# Patient Record
Sex: Male | Born: 1960 | Race: White | Hispanic: Yes | Marital: Married | Smoking: Former smoker
Health system: Southern US, Community
[De-identification: ages and names within clinical notes are randomized; demographics above are authoritative.]

## PROBLEM LIST (undated history)

## (undated) DIAGNOSIS — G4733 Obstructive sleep apnea (adult) (pediatric): Secondary | ICD-10-CM

## (undated) DIAGNOSIS — I10 Essential (primary) hypertension: Secondary | ICD-10-CM

## (undated) DIAGNOSIS — M109 Gout, unspecified: Secondary | ICD-10-CM

## (undated) DIAGNOSIS — R0789 Other chest pain: Secondary | ICD-10-CM

## (undated) DIAGNOSIS — E785 Hyperlipidemia, unspecified: Secondary | ICD-10-CM

## (undated) DIAGNOSIS — E7800 Pure hypercholesterolemia, unspecified: Secondary | ICD-10-CM

## (undated) DIAGNOSIS — R739 Hyperglycemia, unspecified: Secondary | ICD-10-CM

## (undated) DIAGNOSIS — E669 Obesity, unspecified: Secondary | ICD-10-CM

## (undated) DIAGNOSIS — R5383 Other fatigue: Secondary | ICD-10-CM

## (undated) DIAGNOSIS — E119 Type 2 diabetes mellitus without complications: Secondary | ICD-10-CM

## (undated) HISTORY — DX: Gout, unspecified: M10.9

## (undated) HISTORY — DX: Pure hypercholesterolemia, unspecified: E78.00

## (undated) HISTORY — PX: ARTHRODESIS: SHX136

## (undated) HISTORY — DX: Essential (primary) hypertension: I10

## (undated) HISTORY — DX: Obstructive sleep apnea (adult) (pediatric): G47.33

## (undated) HISTORY — DX: Obesity, unspecified: E66.9

## (undated) HISTORY — DX: Hyperglycemia, unspecified: R73.9

## (undated) HISTORY — DX: Other fatigue: R53.83

## (undated) HISTORY — DX: Type 2 diabetes mellitus without complications: E11.9

## (undated) HISTORY — DX: Hyperlipidemia, unspecified: E78.5

## (undated) HISTORY — PX: APPENDECTOMY: SHX54

## (undated) HISTORY — DX: Other chest pain: R07.89

---

## 1992-12-22 HISTORY — PX: ARTHROSCOPIC REPAIR ACL: SUR80

## 2015-02-06 ENCOUNTER — Institutional Professional Consult (permissible substitution): Payer: BLUE CROSS/BLUE SHIELD | Admitting: Neurology

## 2015-02-06 ENCOUNTER — Encounter: Payer: Self-pay | Admitting: Neurology

## 2015-02-06 ENCOUNTER — Telehealth: Payer: Self-pay | Admitting: Neurology

## 2015-02-06 NOTE — Telephone Encounter (Signed)
Patient is a no show for today's visit 02/06/15

## 2015-02-14 ENCOUNTER — Encounter: Payer: Self-pay | Admitting: Neurology

## 2015-02-15 ENCOUNTER — Encounter: Payer: Self-pay | Admitting: Neurology

## 2015-02-15 ENCOUNTER — Ambulatory Visit (INDEPENDENT_AMBULATORY_CARE_PROVIDER_SITE_OTHER): Payer: BLUE CROSS/BLUE SHIELD | Admitting: Neurology

## 2015-02-15 VITALS — BP 117/86 | HR 91 | Temp 98.9°F | Resp 20 | Ht 68.5 in | Wt 271.0 lb

## 2015-02-15 DIAGNOSIS — G4733 Obstructive sleep apnea (adult) (pediatric): Secondary | ICD-10-CM

## 2015-02-15 DIAGNOSIS — E669 Obesity, unspecified: Secondary | ICD-10-CM

## 2015-02-15 DIAGNOSIS — G479 Sleep disorder, unspecified: Secondary | ICD-10-CM

## 2015-02-15 DIAGNOSIS — G478 Other sleep disorders: Secondary | ICD-10-CM

## 2015-02-15 DIAGNOSIS — R351 Nocturia: Secondary | ICD-10-CM

## 2015-02-15 NOTE — Progress Notes (Signed)
Subjective:    Patient ID: Tanush Drees is a 54 y.o. male.  HPI     Huston Foley, MD, PhD [facility] [address], [address] [address] [place], [place] [address]  Dear Vonna Kotyk,   I saw your patient, Roye Gustafson, upon your kind request in my neurologic clinic today for initial consultation of his sleep disorder, in particular concern for underlying obstructive sleep apnea. As you know, Mr. Fitzmaurice is a 54 year old right-handed gentleman with an underlying medical history of hypertension, hyperlipidemia, gout, arthritis of both knees, and obesity, who reports snoring and witnessed apneas, restless sleep and nonrestorative sleep. He usually goes to bed late, after 11 PM and he watches TV in bed. In fact, TV may stay on all night. He falls asleep fine but he has difficulty going back to sleep once he wakes up around 2:30 or 3 AM. He goes to the bathroom, and then it may take him up to an hour to go back to sleep. He has been taking an as needed sleep aid called bromazepam which he gets from [place]. He takes 3 mg pills, half a pill as needed typically no more than twice a month. He has restless sleep and endorses some restless leg symptoms but is not sure if he twitches in his sleep. He does not have any nightmares or dream enactments or sleep talking or sleep walking or sleep paralysis or other signs or symptoms of narcolepsy. His Epworth sleepiness score is 3 out of 24. His rise time is 5:30 or 6 AM. He does not feel rested. His mother is suspected to have sleep apnea per his report. 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 He drinks coffee twice a day, not at dinnertime. He does not drink sodas. He drinks alcohol approximately  minimal 4 glasses of wine per week. He quit smoking 20 years ago. He snores, at times loudly. His wife has noted apneic pauses. He has also woken himself up with gasping sensations. Some 3 weeks  ago he had a pulse oximetry test overnight through your office. I will review those test results when available.  His Past Medical History Is Significant For: Past Medical History  Diagnosis Date  . Gout   . Hypertension   . Hypercholesteremia   . Diabetes mellitus without complication   . OSA (obstructive sleep apnea)   . Obesity     moderate  . Atypical chest pain     His Past Surgical History Is Significant For: Past Surgical History  Procedure Laterality Date  . Appendectomy      age 33  . Arthroscopic repair acl Left     1989  . Arthroscopic repair acl Right 1994    His Family History Is Significant For: Family History  Problem Relation Age of Onset  . Heart Problems Father     known hole in heart  . Heart Problems Mother     His Social History Is Significant For: History   Social History  . Marital Status: Married    Spouse Name: Loma Newton  . Number of Children: 1  . Years of Education: Masters   Occupational History  .      [employer]   Social History Main Topics  . Smoking status: Former Games developer  . Smokeless tobacco: Former Neurosurgeon     Comment: quit 20 years ago  . Alcohol Use: 0.0 oz/week    0 Standard drinks or equivalent  per week     Comment: 4-5 drinks weekly  . Drug Use: No  . Sexual Activity: Not on file   Other Topics Concern  . None   Social History Narrative   Patient consumes 1 cup of coffee am and pm    His Allergies Are:  Allergies  Allergen Reactions  . Aspirin Rash  . Sulfa Antibiotics Rash  :   His Current Medications Are:  Outpatient Encounter Prescriptions as of 02/15/2015  Medication Sig  . allopurinol (ZYLOPRIM) 100 MG tablet Take 100 mg by mouth daily.  [name] atorvastatin (LIPITOR) 10 MG tablet   . cefdinir (OMNICEF) 300 MG capsule Take 300 mg by mouth 2 (two) times daily.  . chlorthalidone (HYGROTON) 25 MG tablet Take 25 mg by mouth daily.  [name] eplerenone (INSPRA) 50 MG tablet   . fexofenadine (ALLEGRA) 180 MG tablet Take  180 mg by mouth daily. As needed  . fluticasone (FLOVENT DISKUS) 50 MCG/BLIST diskus inhaler Inhale 1 puff into the lungs daily. One spray each nostril daily as needed  . HYDROcodone-homatropine (HYCODAN) 5-1.5 MG/5ML syrup   . hydrOXYzine (VISTARIL) 25 MG capsule   . meloxicam (MOBIC) 15 MG tablet Take 15 mg by mouth daily.  . metFORMIN (GLUCOPHAGE) 850 MG tablet   . METOPROLOL TARTRATE PO Take 100 mg by mouth daily at 2 PM daily at 2 PM.  . probenecid (BENEMID) 500 MG tablet Take 500 mg by mouth daily.  . valsartan-hydrochlorothiazide (DIOVAN-HCT) 320-25 MG per tablet   . Vitamin D, Ergocalciferol, (DRISDOL) 50000 UNITS CAPS capsule Take 50,000 Units by mouth every 7 (seven) days.  . [DISCONTINUED] colchicine 0.6 MG tablet Take 0.6 mg by mouth daily. As needed  . [DISCONTINUED] LOSARTAN POTASSIUM PO Take 100 mg by mouth daily at 2 PM daily at 2 PM.  . [DISCONTINUED] lovastatin (MEVACOR) 40 MG tablet Take 40 mg by mouth at bedtime.  :  Review of Systems:  Out of a complete 14 point review of systems, all are reviewed and negative with the exception of these symptoms as listed below:   Review of Systems  Constitutional:       Weight gain  HENT:       Ringing in ears  Neurological:       Snoring  Psychiatric/Behavioral:       Not enough sleep    Objective:  Neurologic Exam  Physical Exam Physical Examination:   Filed Vitals:   02/15/15 0853  BP: 117/86  Pulse: 91  Temp: 98.9 F (37.2 C)  Resp: 20    General Examination: The patient is a very pleasant 54 y.o. male in no acute distress. He appears well-developed and well-nourished and very well groomed. He is obese.   HEENT: Normocephalic, atraumatic, pupils are equal, round and reactive to light and accommodation. Funduscopic exam is normal with sharp disc margins noted. Extraocular tracking is good without limitation to gaze excursion or nystagmus noted. Normal smooth pursuit is noted. Hearing is grossly intact. Tympanic  membranes are clear bilaterally. Face is symmetric with normal facial animation and normal facial sensation. Speech is clear with no dysarthria noted. There is no hypophonia. There is no lip, neck/head, jaw or voice tremor. Neck is supple with full range of passive and active motion. There are no carotid bruits on auscultation. Oropharynx exam reveals: mild mouth dryness, adequate dental hygiene and moderate airway crowding, due to narrow airway entry, tonsils are 1+ bilaterally, and large tongue Mallampati is class II. Tongue protrudes centrally and  palate elevates symmetrically. Tonsils are 1+. Neck size is 21.5 inches. He has a Mild overbite. Nasal inspection reveals no significant nasal mucosal bogginess or redness and no septal deviation.   Chest: Clear to auscultation without wheezing, rhonchi or crackles noted.  Heart: S1+S2+0, regular and normal without murmurs, rubs or gallops noted.   Abdomen: Soft, non-tender and non-distended with normal bowel sounds appreciated on auscultation.  Extremities: There is trace pitting edema in the distal lower extremities bilaterally. Pedal pulses are intact.  Skin: Warm and dry without trophic changes noted. There are no varicose veins.  Musculoskeletal: exam reveals no obvious joint deformities, tenderness or joint swelling or erythema.   Neurologically:  Mental status: The patient is awake, alert and oriented in all 4 spheres. His immediate and remote memory, attention, language skills and fund of knowledge are appropriate. There is no evidence of aphasia, agnosia, apraxia or anomia. Speech is clear with normal prosody and enunciation. Thought process is linear. Mood is normal and affect is normal.  Cranial nerves II - XII are as described above under HEENT exam. In addition: shoulder shrug is normal with equal shoulder height noted. Motor exam: Normal bulk, strength and tone is noted. There is no drift, tremor or rebound. Romberg is negative. Reflexes  are 2+ throughout. Babinski: Toes are flexor bilaterally. Fine motor skills and coordination: intact with normal finger taps, normal hand movements, normal rapid alternating patting, normal foot taps and normal foot agility.  Cerebellar testing: No dysmetria or intention tremor on finger to nose testing. Heel to shin is unremarkable bilaterally. There is no truncal or gait ataxia.  Sensory exam: intact to light touch, pinprick, vibration, temperature sense in the upper and lower extremities.  Gait, station and balance: He stands easily. No veering to one side is noted. No leaning to one side is noted. Posture is age-appropriate and stance is narrow based. Gait shows normal stride length and normal pace. No problems turning are noted. He turns en bloc. Tandem walk is unremarkable.  Assessment and Plan:   In summary, Cline CoolsMarvin Southgate is a very pleasant 54 y.o.-year old male with an underlying medical history of hypertension, hyperlipidemia, gout, arthritis of both knees, and obesity, who has a telltale history for obstructive sleep apnea. His physical exam is concerning for underlying OSA. Otherwise his exam is nonfocal and I reassured them in that regard. I had a long chat with the patient about my findings and the diagnosis of OSA, its prognosis and treatment options. We talked about medical treatments, surgical interventions and non-pharmacological approaches. I explained in particular the risks and ramifications of untreated moderate to severe OSA, especially with respect to developing cardiovascular disease down the Road, including congestive heart failure, difficult to treat hypertension, cardiac arrhythmias, or stroke. Even type 2 diabetes has, in part, been linked to untreated OSA. Symptoms of untreated OSA include daytime sleepiness, memory problems, mood irritability and mood disorder such as depression and anxiety, lack of energy, as well as recurrent headaches, especially morning headaches. We talked  about trying to maintain a healthy lifestyle in general, as well as the importance of weight control. I encouraged the patient to eat healthy, exercise daily and keep well hydrated, to keep a scheduled bedtime and wake time routine, to not skip any meals and eat healthy snacks in between meals. I advised the patient not to drive when feeling sleepy. I recommended the following at this time: sleep study with potential positive airway pressure titration. (We will score hypopneas at 3%  and split the sleep study into diagnostic and treatment portion, if the estimated. 2 hour AHI is >15/h).   I explained the sleep test procedure to the patient and also outlined possible surgical and non-surgical treatment options of OSA, including the use of a custom-made dental device (which would require a referral to a specialist dentist or oral surgeon), upper airway surgical options, such as pillar implants, radiofrequency surgery, tongue base surgery, and UPPP (which would involve a referral to an ENT surgeon). Rarely, jaw surgery such as mandibular advancement may be considered.  I also explained the CPAP treatment option to the patient, who indicated that he would be willing to try CPAP if the need arises. I explained the importance of being compliant with PAP treatment, not only for insurance purposes but primarily to improve His symptoms, and for the patient's long term health benefit, including to reduce His cardiovascular risks. I answered all his questions today and the patient was in agreement. I would like to see him back after the sleep study is completed and encouraged him to call with any interim questions, concerns, problems or updates.   Thank you very much for allowing me to participate in the care of this nice patient. If I can be of any further assistance to you please do not hesitate to call me at [phone].  Sincerely,   Huston Foley, MD, PhD

## 2015-02-15 NOTE — Patient Instructions (Signed)

## 2015-04-21 ENCOUNTER — Telehealth: Payer: Self-pay | Admitting: *Deleted

## 2019-02-07 ENCOUNTER — Encounter: Payer: Self-pay | Admitting: Cardiology

## 2019-02-07 DIAGNOSIS — R739 Hyperglycemia, unspecified: Secondary | ICD-10-CM

## 2019-02-07 DIAGNOSIS — E7800 Pure hypercholesterolemia, unspecified: Secondary | ICD-10-CM | POA: Insufficient documentation

## 2019-02-07 DIAGNOSIS — I10 Essential (primary) hypertension: Secondary | ICD-10-CM

## 2019-02-07 DIAGNOSIS — E785 Hyperlipidemia, unspecified: Secondary | ICD-10-CM

## 2019-02-07 DIAGNOSIS — Z0189 Encounter for other specified special examinations: Secondary | ICD-10-CM | POA: Insufficient documentation

## 2019-02-07 HISTORY — DX: Essential (primary) hypertension: I10

## 2019-02-07 HISTORY — DX: Hyperglycemia, unspecified: R73.9

## 2019-02-07 HISTORY — DX: Hyperlipidemia, unspecified: E78.5

## 2019-02-08 DIAGNOSIS — R7303 Prediabetes: Secondary | ICD-10-CM | POA: Insufficient documentation

## 2019-02-08 DIAGNOSIS — G4733 Obstructive sleep apnea (adult) (pediatric): Secondary | ICD-10-CM | POA: Insufficient documentation

## 2019-02-08 DIAGNOSIS — M109 Gout, unspecified: Secondary | ICD-10-CM | POA: Insufficient documentation

## 2019-02-08 DIAGNOSIS — E669 Obesity, unspecified: Secondary | ICD-10-CM | POA: Insufficient documentation

## 2019-02-08 DIAGNOSIS — G4700 Insomnia, unspecified: Secondary | ICD-10-CM | POA: Insufficient documentation

## 2019-02-08 DIAGNOSIS — R5383 Other fatigue: Secondary | ICD-10-CM

## 2019-02-08 DIAGNOSIS — I2781 Cor pulmonale (chronic): Secondary | ICD-10-CM | POA: Insufficient documentation

## 2019-02-08 DIAGNOSIS — R0789 Other chest pain: Secondary | ICD-10-CM | POA: Insufficient documentation

## 2019-02-08 HISTORY — DX: Obstructive sleep apnea (adult) (pediatric): G47.33

## 2019-02-08 HISTORY — DX: Other fatigue: R53.83

## 2019-02-08 NOTE — Progress Notes (Signed)
Subjective:  Primary Physician:  Jeff Heman, NP  Patient ID: Jeff Brewer, male    DOB: [birth year], 58 y.o.   MRN: [id number]  Chief Complaint  Patient presents with  . Hypertension    6 month f/u    HPI: Jeff Brewer  is a 58 y.o. male  with  hypertension, hyperlipidemia, hyperglycemia, prediabetes, mild obesity, family history of premature is CAD, gout, chronic corpulmonale and OSA on CPAP, Atypical chest pain with positive nuclear stress test in February 2016 revealing abnormal EKG response to treadmill stress test along with inferior apical ischemia, was started on aggressive medical therapy including weight loss.    He had complete resolution of symptoms and with weight loss his labs improved.  He initially weighed greater than 275 pounds with a BMI of close to morbid obesity of 39.  He now presents for 6 month follow-up visit.  Echocardiogram in 2016had revealed mild RV dilatation.  States that he is feeling the best he has but weight loss has plateaued.he has no chest pain or dyspnea.  Due to withdrawal of Belviq which she has been on for about a year to year and a half,he is concerned about weight gain.  Prior to this he was on Contrave for a year and a half.  Past Medical History:  Diagnosis Date  . Atypical chest pain   . Diabetes mellitus without complication ([place])   . Essential hypertension 02/07/2019  . Fatigue 02/08/2019  . Gout   . Hypercholesteremia   . Hyperglycemia 02/07/2019  . Hyperlipidemia 02/07/2019  . Hypertension   . Obesity    moderate  . Obstructive sleep apnea 02/08/2019   Resolved with weight loss  . OSA (obstructive sleep apnea)     Past Surgical History:  Procedure Laterality Date  . APPENDECTOMY     age 62  . ARTHROSCOPIC REPAIR ACL Left    1989  . ARTHROSCOPIC REPAIR ACL Right 1994    Social History   Socioeconomic History  . Marital status: Married    Spouse name: Jeff Brewer  . Number of children: 1  . Years of education:  Masters  . Highest education level: Not on file  Occupational History    Comment: [employer]  Social Needs  . Financial resource strain: Not on file  . Food insecurity:    Worry: Not on file    Inability: Not on file  . Transportation needs:    Medical: Not on file    Non-medical: Not on file  Tobacco Use  . Smoking status: Former Research scientist (life sciences)  . Smokeless tobacco: Former Systems developer  . Tobacco comment: quit 20 years ago  Substance and Sexual Activity  . Alcohol use: Yes    Alcohol/week: 0.0 standard drinks    Comment: 4-5 drinks weekly  . Drug use: No  . Sexual activity: Not on file  Lifestyle  . Physical activity:    Days per week: Not on file    Minutes per session: Not on file  . Stress: Not on file  Relationships  . Social connections:    Talks on phone: Not on file    Gets together: Not on file    Attends religious service: Not on file    Active member of club or organization: Not on file    Attends meetings of clubs or organizations: Not on file    Relationship status: Not on file  . Intimate partner violence:    Fear of current or ex partner: Not on  file    Emotionally abused: Not on file    Physically abused: Not on file    Forced sexual activity: Not on file  Other Topics Concern  . Not on file  Social History Narrative   Patient consumes 1 cup of coffee am and pm    Current Outpatient Medications on File Prior to Visit  Medication Sig Dispense Refill  . allopurinol (ZYLOPRIM) 100 MG tablet Take 100 mg by mouth daily.    [name] ALPRAZolam (XANAX XR) 0.5 MG 24 hr tablet Take 0.5 mg by mouth daily.    [name] aspirin EC 81 MG tablet Take 81 mg by mouth daily.    . colchicine 0.6 MG tablet Take 0.6 mg by mouth daily.    . fexofenadine (ALLEGRA) 180 MG tablet Take 180 mg by mouth daily. As needed    . fluticasone (FLOVENT DISKUS) 50 MCG/BLIST diskus inhaler Inhale 1 puff into the lungs daily. One spray each nostril daily as needed    . hydrALAZINE (APRESOLINE) 25 MG tablet Take 25  mg by mouth 3 (three) times daily.    . Lorcaserin HCl ER (BELVIQ XR) 20 MG TB24 Take 20 mg by mouth daily.    . meloxicam (MOBIC) 15 MG tablet Take 15 mg by mouth daily.    . Naltrexone-buPROPion HCl ER (CONTRAVE) 8-90 MG TB12 Take 1 tablet by mouth 2 (two) times a week.    . NON FORMULARY Honey ginger with lime    . rosuvastatin (CRESTOR) 20 MG tablet Take 20 mg by mouth daily.    . sildenafil (VIAGRA) 100 MG tablet Take 100 mg by mouth daily as needed for erectile dysfunction.    . valsartan-hydrochlorothiazide (DIOVAN-HCT) 320-25 MG per tablet   0  . verapamil (VERELAN PM) 180 MG 24 hr capsule Take 180 mg by mouth at bedtime.    . Vitamin D, Ergocalciferol, (DRISDOL) 50000 UNITS CAPS capsule Take 50,000 Units by mouth every 7 (seven) days.    [name] eplerenone (INSPRA) 50 MG tablet   0  . METOPROLOL TARTRATE PO Take 100 mg by mouth daily at 2 PM daily at 2 PM.    . probenecid (BENEMID) 500 MG tablet Take 500 mg by mouth daily.     No current facility-administered medications on file prior to visit.      Review of Systems  Constitutional: Negative for malaise/fatigue and weight loss.  Respiratory: Negative for cough, hemoptysis and shortness of breath.   Cardiovascular: Negative for chest pain, palpitations, claudication and leg swelling.  Gastrointestinal: Negative for abdominal pain, blood in stool, constipation, heartburn and vomiting.  Genitourinary: Negative for dysuria.  Musculoskeletal: Negative for joint pain and myalgias.  Neurological: Negative for dizziness, focal weakness and headaches.  Endo/Heme/Allergies: Does not bruise/bleed easily.  Psychiatric/Behavioral: Negative for depression. The patient is not nervous/anxious.   All other systems reviewed and are negative.      Objective:  Blood pressure (!) 136/57, pulse 76, height '5\' 10"'$  (1.778 m), weight 236 lb (107 kg), SpO2 97 %. Body mass index is 33.86 kg/m.   Physical Exam  Constitutional: He appears well-developed.  No distress.  Mildly obese  HENT:  Head: Atraumatic.  Eyes: Conjunctivae are normal.  Neck: Neck supple. No JVD present. No thyromegaly present.  Cardiovascular: Normal rate, regular rhythm, normal heart sounds and intact distal pulses. Exam reveals no gallop.  No murmur heard. Pulmonary/Chest: Effort normal and breath sounds normal.  Abdominal: Soft. Bowel sounds are normal.  Musculoskeletal: Normal range of  motion.        General: No edema.  Neurological: He is alert.  Skin: Skin is warm and dry.  Psychiatric: He has a normal mood and affect.   CARDIAC STUDIES:  Exercise sestamibi stress test 01/26/2015: 1. The resting electrocardiogram demonstrated normal sinus rhythm, normal resting conduction, no resting arrhythmias and normal rest repolarization. Low voltage.   The stress electrocardiogram revealed 1 mm of upsloping ST depression in lead (s):II, III, aVF, V5, V6 which was back to base at < 2 minutes into recovery. The patient performed treadmill exercise using a Bruce protocol, completing 7 minutes, an estimated workload of 7.3 METS. The heart rate was 90% MPHR. The stress test was terminated because of fatigue and dyspnea. 2. There is suggestion of very mild mid inferior to inferoapical ischemia. Gated SPECT images reveal normal myocardial thickening and wall motion.  The left ventricular ejection fraction was calculated or visually estimated to be 57%, normal.  Overall this represents a low risk study, clinical correlation recommended the patient weighs 270 pounds.  Echo- 01/30/15 1. Left ventricle cavity is normal in size. Mild to moderate concentric hypertrophy of the left ventricle. Normal global wall motion. Calculated EF 55%. 2. Right atrial cavity is slightly dilated. 3. Right ventricle cavity is slightly dilated. Normal right ventricular function. 4. Trace mitral regurgitation. 5. Trace tricuspid regurgitation. No evidence of pulmonary hypertension.  Assessment &  Recommendations:   1. Chronic cor pulmonale (HCC) Mild RV dilatation was noted in 2016, I suspect is probably complete resolution of the same, he remains completely asymptomatic, BMI has reduced from close to being 40 to the present 33.  2. Essential hypertension EKG 02/09/2019: Normal sinus rhythm at rate of 7 1 bpm, normal axis.  No evidence of ischemia, normal EKG.  Baseline artifact.  3. Pure hypercholesterolemia Needs lipid profile testing. 4. Obesity, unspecified classification, unspecified obesity type, unspecified whether serious comorbidity present  5. Hyperglycemia Significant improvement in glycemic control since weight loss.  He has maintained weight loss over the past 3 years.  6. Laboratory examination Labs 01/11/2019: Serum glucose 87 mg, BUN 16, creatinine 1.33, eGFR 68 mL.  08/06/2018: TSH 2.79.  07/29/2018: Hemoglobin A1c 5.7%.  Uric acid 6.9.  03/31/2018: Creatinine 1.27, EGFR 62/72, potassium 4.3, CMP normal.  Cholesterol 136, triglycerides 161, HDL 51, LDL 53.  Hemoglobin A1c 6.4%.  06/23/2017: Cholesterol 126, triglycerides 146, HDL 40, LDL 57.  Creatinine 1.2, potassium 4.3, CMP normal.  Uric acid normal at 7.5.  7. Obstructive sleep apnea Has resolved.  Recommendation: He has now established with Ms. Tora Perches, NP, .I have discussed with him regarding withdrawal of Belviq from the market and the risk of colorectal cancer, lung cancer.  I have reassured him that as he has not been smoking actively, and he has had routine colonoscopy screening, overall risk is very low. Blood pressure is well controlled, needs lipid profile testing, I do not think he needs a repeat echocardiogram to confirm resolution of RV dilatation.  There is no clinical evidence of congestive heart failure.  The benefits of weight loss, overall health, in a patient with previous RV dilatation and chronic cor pulmonale an abnormal nuclear stress test out weight the minimal risk associated with the  therapy but would recommend no to continue any further weight loss medications and permanent life style changes discussed.  I will see him back in one year.  Adrian Prows, MD, [facility] 02/10/2019, 6:22 AM [facility] [place] Pager: [phone] Office: [phone] If no answer  Cell [phone]   .

## 2019-02-09 ENCOUNTER — Ambulatory Visit (INDEPENDENT_AMBULATORY_CARE_PROVIDER_SITE_OTHER): Payer: BC Managed Care – PPO | Admitting: Cardiology

## 2019-02-09 ENCOUNTER — Encounter: Payer: Self-pay | Admitting: Cardiology

## 2019-02-09 ENCOUNTER — Other Ambulatory Visit: Payer: Self-pay

## 2019-02-09 VITALS — BP 136/57 | HR 76 | Ht 70.0 in | Wt 236.0 lb

## 2019-02-09 DIAGNOSIS — E66811 Obesity, class 1: Secondary | ICD-10-CM

## 2019-02-09 DIAGNOSIS — Z0189 Encounter for other specified special examinations: Secondary | ICD-10-CM

## 2019-02-09 DIAGNOSIS — E7800 Pure hypercholesterolemia, unspecified: Secondary | ICD-10-CM

## 2019-02-09 DIAGNOSIS — R739 Hyperglycemia, unspecified: Secondary | ICD-10-CM

## 2019-02-09 DIAGNOSIS — I10 Essential (primary) hypertension: Secondary | ICD-10-CM

## 2019-02-09 DIAGNOSIS — Z6833 Body mass index (BMI) 33.0-33.9, adult: Secondary | ICD-10-CM

## 2019-02-09 DIAGNOSIS — I2781 Cor pulmonale (chronic): Secondary | ICD-10-CM

## 2019-02-09 DIAGNOSIS — Z1211 Encounter for screening for malignant neoplasm of colon: Secondary | ICD-10-CM

## 2019-02-09 DIAGNOSIS — E6609 Other obesity due to excess calories: Secondary | ICD-10-CM

## 2019-02-09 DIAGNOSIS — Z125 Encounter for screening for malignant neoplasm of prostate: Secondary | ICD-10-CM

## 2019-02-10 ENCOUNTER — Telehealth: Payer: Self-pay

## 2019-02-10 NOTE — Telephone Encounter (Signed)
Jeff Brewer from [facility] called stated that patients current medication Belviq is being taken off the market due to increase cancer risk. Is there an alternative medication patient can take?

## 2019-02-11 NOTE — Telephone Encounter (Signed)
I am going to monitor his weight and the medication effects are already discussed with the patient

## 2019-05-13 ENCOUNTER — Other Ambulatory Visit: Payer: Self-pay | Admitting: Cardiology

## 2019-05-23 ENCOUNTER — Telehealth: Payer: Self-pay

## 2019-05-23 NOTE — Telephone Encounter (Signed)
Pt called wants to let you know that he has gained 7 lb and you told him if this happens its some sort of shot he can get ? Please advise

## 2019-05-23 NOTE — Telephone Encounter (Signed)
Saxenda, SQ, I can send  in the Rx

## 2019-05-24 ENCOUNTER — Ambulatory Visit (INDEPENDENT_AMBULATORY_CARE_PROVIDER_SITE_OTHER): Payer: BC Managed Care – PPO | Admitting: Cardiology

## 2019-05-24 DIAGNOSIS — R739 Hyperglycemia, unspecified: Secondary | ICD-10-CM

## 2019-05-24 DIAGNOSIS — E669 Obesity, unspecified: Secondary | ICD-10-CM | POA: Diagnosis not present

## 2019-05-24 DIAGNOSIS — I10 Essential (primary) hypertension: Secondary | ICD-10-CM | POA: Diagnosis not present

## 2019-05-24 NOTE — Telephone Encounter (Signed)
Pt came into office and was educated on Saxenda and administered injection.//ah

## 2019-05-25 ENCOUNTER — Other Ambulatory Visit: Payer: Self-pay

## 2019-05-26 ENCOUNTER — Other Ambulatory Visit: Payer: Self-pay | Admitting: Cardiology

## 2019-05-26 MED ORDER — LIRAGLUTIDE -WEIGHT MANAGEMENT 18 MG/3ML ~~LOC~~ SOPN: 0.6000 mL | PEN_INJECTOR | Freq: Every day | SUBCUTANEOUS | 0 refills | Status: DC

## 2019-05-28 MED ORDER — LIRAGLUTIDE -WEIGHT MANAGEMENT 18 MG/3ML ~~LOC~~ SOPN: 3.0000 mg | PEN_INJECTOR | Freq: Every day | SUBCUTANEOUS | 3 refills | Status: DC

## 2019-05-28 MED ORDER — "NEEDLE (DISP) 23G X 1"" MISC": 1.0000 [IU] | Freq: Every day | 4 refills | Status: DC

## 2019-05-28 NOTE — Progress Notes (Signed)
Jeff Brewer  is a 58 y.o. male  with  hypertension, hyperlipidemia, hyperglycemia, prediabetes, mild obesity, family history of premature is CAD, gout, chronic corpulmonale and OSA on CPAP, Atypical chest pain with positive nuclear stress test in February 2016 revealing abnormal EKG response to treadmill stress test along with inferior apical ischemia, was started on aggressive medical therapy including weight loss.    He had complete resolution of symptoms and with weight loss his labs improved.  He initially weighed greater than 275 pounds with a BMI of close to morbid obesity of 39.  He now presents for 6 month follow-up visit.  Echocardiogram in 2016 had revealed mild RV dilatation.   He has started to gain weight, wanted to try Sexenda and here to get started.  He is aware of the risks associated with the medication including but not limited to severe nausea, pancreatitis, insomnia.  Patient very reliable and is willing to monitor.  Mild obesity - Plan: Liraglutide -Weight Management (SAXENDA) 18 MG/3ML SOPN  Essential hypertension  Hyperglycemia - Plan: Liraglutide -Weight Management (SAXENDA) 18 MG/3ML SOPN, NEEDLE, DISP, 23 G (BD DISP NEEDLE) 23G X 1" MISC, CMP14+EGFR

## 2019-05-28 NOTE — Addendum Note (Signed)
Addended by: Kela Millin on: 05/28/2019 08:44 AM   Modules accepted: Orders

## 2019-06-10 MED ORDER — LIRAGLUTIDE -WEIGHT MANAGEMENT 18 MG/3ML ~~LOC~~ SOPN
18.0000 mL | PEN_INJECTOR | Freq: Once | SUBCUTANEOUS | Status: AC
  Administered 2019-06-10: 18 mL via SUBCUTANEOUS

## 2019-06-10 NOTE — Addendum Note (Signed)
Addended by: Georgeanna Harrison on: 06/10/2019 04:20 PM   Modules accepted: Orders

## 2019-06-27 ENCOUNTER — Encounter: Payer: Self-pay | Admitting: Cardiology

## 2019-06-27 ENCOUNTER — Ambulatory Visit: Payer: BC Managed Care – PPO | Admitting: Cardiology

## 2019-06-27 ENCOUNTER — Other Ambulatory Visit: Payer: Self-pay

## 2019-06-27 VITALS — BP 120/70 | HR 82 | Ht 70.0 in | Wt 244.0 lb

## 2019-06-27 DIAGNOSIS — Z6835 Body mass index (BMI) 35.0-35.9, adult: Secondary | ICD-10-CM | POA: Diagnosis not present

## 2019-06-27 DIAGNOSIS — I10 Essential (primary) hypertension: Secondary | ICD-10-CM

## 2019-06-27 MED ORDER — PHENTERMINE HCL 15 MG PO CAPS: 15.0000 mg | ORAL_CAPSULE | Freq: Two times a day (BID) | ORAL | 2 refills | Status: DC

## 2019-06-27 NOTE — Progress Notes (Signed)
Virtual Visit via Telephone Note: Patient unable to use video assisted device.  This visit type was conducted due to national recommendations for restrictions regarding the COVID-19 Pandemic (e.g. social distancing).  This format is felt to be most appropriate for this patient at this time.  All issues noted in this document were discussed and addressed.  No physical exam was performed.  The patient has consented to conduct a Telehealth visit and understands insurance will be billed.   I connected with@, on 06/27/19 at  by TELEPHONE and verified that I am speaking with the correct person using two identifiers.   I discussed the limitations of evaluation and management by telemedicine and the availability of in person appointments. The patient expressed understanding and agreed to proceed.   I have discussed with patient regarding the safety during COVID Pandemic and steps and precautions to be taken including social distancing, frequent hand wash and use of detergent soap, gels with the patient. I asked the patient to avoid touching mouth, nose, eyes, ears with the hands. I encouraged regular walking around the neighborhood and exercise and regular diet, as long as social distancing can be maintained.  Primary Physician/Referring:  Belia Heman, NP  Patient ID: Jeff Brewer, male    DOB: Nov 03, 1961, 58 y.o.   MRN: [id number]  Chief Complaint  Patient presents with  . weight medication    pt gained weight    HPI: Jeff Brewer  is a 58 y.o. male  with  hypertension, hyperlipidemia, hyperglycemia, prediabetes, mild obesity, family history of premature is CAD, gout, chronic corpulmonale and OSA on CPAP, Atypical chest pain with positive nuclear stress test in February 2016 revealing abnormal EKG response to treadmill stress test along with inferior apical ischemia, was started on aggressive medical therapy including weight loss.    He had complete resolution of symptoms and with weight  loss his labs improved.  He initially weighed greater than 275 pounds with a BMI of close to morbid obesity of 39. He tried [place] for weight loss, but finds it too expensive and has again gained weight and he is worried that he will be back to his original weight. No chest pain or dyspnea.   Past Medical History:  Diagnosis Date  . Atypical chest pain   . Diabetes mellitus without complication ([place])   . Essential hypertension 02/07/2019  . Fatigue 02/08/2019  . Gout   . Hypercholesteremia   . Hyperglycemia 02/07/2019  . Hyperlipidemia 02/07/2019  . Hypertension   . Obesity    moderate  . Obstructive sleep apnea 02/08/2019   Resolved with weight loss  . OSA (obstructive sleep apnea)     Past Surgical History:  Procedure Laterality Date  . APPENDECTOMY     age 58  . ARTHROSCOPIC REPAIR ACL Left    1989  . ARTHROSCOPIC REPAIR ACL Right 1994    Social History   Socioeconomic History  . Marital status: Married    Spouse name: Ardelle Park  . Number of children: 1  . Years of education: Masters  . Highest education level: Not on file  Occupational History    Comment: [employer]  Social Needs  . Financial resource strain: Not on file  . Food insecurity    Worry: Not on file    Inability: Not on file  . Transportation needs    Medical: Not on file    Non-medical: Not on file  Tobacco Use  . Smoking status: Former Research scientist (life sciences)  . Smokeless tobacco: Former  User  . Tobacco comment: quit 20 years ago  Substance and Sexual Activity  . Alcohol use: Yes    Alcohol/week: 0.0 standard drinks    Comment: 4-5 drinks weekly  . Drug use: No  . Sexual activity: Not on file  Lifestyle  . Physical activity    Days per week: Not on file    Minutes per session: Not on file  . Stress: Not on file  Relationships  . Social Herbalist on phone: Not on file    Gets together: Not on file    Attends religious service: Not on file    Active member of club or organization: Not on file     Attends meetings of clubs or organizations: Not on file    Relationship status: Not on file  . Intimate partner violence    Fear of current or ex partner: Not on file    Emotionally abused: Not on file    Physically abused: Not on file    Forced sexual activity: Not on file  Other Topics Concern  . Not on file  Social History Narrative   Patient consumes 1 cup of coffee am and pm    Review of Systems  Constitution: Positive for weight gain. Negative for chills, decreased appetite and malaise/fatigue.  Cardiovascular: Negative for dyspnea on exertion, leg swelling and syncope.  Endocrine: Negative for cold intolerance.  Hematologic/Lymphatic: Does not bruise/bleed easily.  Musculoskeletal: Positive for arthritis (bilateral knee). Negative for joint swelling.  Gastrointestinal: Negative for abdominal pain, anorexia, change in bowel habit, hematochezia and melena.  Neurological: Negative for headaches and light-headedness.  Psychiatric/Behavioral: Negative for depression and substance abuse.  All other systems reviewed and are negative.   Objective  Blood pressure 120/70, pulse 82, height '5\' 10"'$  (1.778 m), weight 244 lb (110.7 kg). Body mass index is 35.01 kg/m. Physical exam not performed or limited due to virtual visit.    Please see exam details from prior visit is as below.    Physical Exam  Constitutional: He appears well-developed. No distress.  Mildly obese  HENT:  Head: Atraumatic.  Eyes: Conjunctivae are normal.  Neck: Neck supple. No JVD present. No thyromegaly present.  Cardiovascular: Normal rate, regular rhythm, normal heart sounds and intact distal pulses. Exam reveals no gallop.  No murmur heard. Pulmonary/Chest: Effort normal and breath sounds normal.  Abdominal: Soft. Bowel sounds are normal.  Musculoskeletal: Normal range of motion.        General: No edema.  Neurological: He is alert.  Skin: Skin is warm and dry.  Psychiatric: He has a normal mood and  affect.   Radiology: No results found.  Laboratory examination:   Labs 01/11/2019: Serum glucose 87 mg, BUN 16, creatinine 1.33, eGFR 68 mL.  08/06/2018: TSH 2.79.  07/29/2018: Hemoglobin A1c 5.7%.  Uric acid 6.9.  03/31/2018: Creatinine 1.27, EGFR 62/72, potassium 4.3, CMP normal.  Cholesterol 136, triglycerides 161, HDL 51, LDL 53.  Hemoglobin A1c 6.4%.  06/23/2017: Cholesterol 126, triglycerides 146, HDL 40, LDL 57.  Creatinine 1.2, potassium 4.3, CMP normal.  Uric acid normal at 7.5. No flowsheet data found. No flowsheet data found. Lipid Panel  No results found for: CHOL, TRIG, HDL, CHOLHDL, VLDL, LDLCALC, LDLDIRECT HEMOGLOBIN A1C No results found for: HGBA1C, MPG TSH No results for input(s): TSH in the last 8760 hours.  Medications   Current Outpatient Medications  Medication Instructions  . allopurinol (ZYLOPRIM) 100 mg, Oral, Daily  . ALPRAZolam (XANAX XR) 0.5  mg, Oral, Daily  . aspirin EC 81 mg, Oral, Daily  . colchicine 0.6 mg, Oral, Daily  . eplerenone (INSPRA) 50 MG tablet No dose, route, or frequency recorded.  . fexofenadine (ALLEGRA) 180 mg, Oral, Daily, As needed   . fluticasone (FLOVENT DISKUS) 50 MCG/BLIST diskus inhaler 1 puff, Inhalation, Daily, One spray each nostril daily as needed   . hydrALAZINE (APRESOLINE) 25 mg, Oral, Daily  . Liraglutide -Weight Management (SAXENDA) 3 mg, Subcutaneous, Daily, Take as directed  . meloxicam (MOBIC) 15 mg, Oral, Daily  . METOPROLOL TARTRATE PO 100 mg, Oral, Daily  . NEEDLE (DISP) 23 G (BD DISP NEEDLE) 1 Units, Does not apply, Daily  . NON FORMULARY Honey ginger with lime   . phentermine 15 mg, Oral, 2 times daily, 7 AM and 12 noon  . probenecid (BENEMID) 500 mg, Oral, Daily  . rosuvastatin (CRESTOR) 20 mg, Oral, Daily  . sildenafil (VIAGRA) 100 mg, Oral, Daily PRN  . valsartan-hydrochlorothiazide (DIOVAN-HCT) 320-12.5 MG tablet TAKE ONE TABLET BY MOUTH EVERY MORNING  . valsartan-hydrochlorothiazide (DIOVAN-HCT) 320-25  MG per tablet No dose, route, or frequency recorded.  . verapamil (VERELAN PM) 180 MG 24 hr capsule TAKE 1 CAPSULE BY MOUTH EVERY EVENING AFTER DINNER  . Vitamin D (Ergocalciferol) (DRISDOL) 50,000 Units, Oral, Every 7 days   Cardiac Studies:   CARDIAC STUDIES:  Exercise sestamibi stress test 01/26/2015: 1. The resting electrocardiogram demonstrated normal sinus rhythm, normal resting conduction, no resting arrhythmias and normal rest repolarization. Low voltage.   The stress electrocardiogram revealed 1 mm of upsloping ST depression in lead (s):II, III, aVF, V5, V6 which was back to base at < 2 minutes into recovery. The patient performed treadmill exercise using a Bruce protocol, completing 7 minutes, an estimated workload of 7.3 METS. The heart rate was 90% MPHR. The stress test was terminated because of fatigue and dyspnea. 2. There is suggestion of very mild mid inferior to inferoapical ischemia. Gated SPECT images reveal normal myocardial thickening and wall motion.  The left ventricular ejection fraction was calculated or visually estimated to be 57%, normal.  Overall this represents a low risk study, clinical correlation recommended the patient weighs 270 pounds.  Echo- 01/30/15 1. Left ventricle cavity is normal in size. Mild to moderate concentric hypertrophy of the left ventricle. Normal global wall motion. Calculated EF 55%. 2. Right atrial cavity is slightly dilated. 3. Right ventricle cavity is slightly dilated. Normal right ventricular function. 4. Trace mitral regurgitation. 5. Trace tricuspid regurgitation. No evidence of pulmonary hypertension.  Assessment   Essential hypertension  Class 2 severe obesity due to excess calories with serious comorbidity and body mass index (BMI) of 35.0 to 35.9 in adult [facility]) - Plan: phentermine 15 MG capsule   EKG 02/09/2019: Normal sinus rhythm at rate of 7 1 bpm, normal axis.  No evidence of ischemia, normal EKG.  Baseline artifact.   Recommendations:   Patient was started on Saxenda, he did not see a change in weight but also was very expensive. Previously about 3-4 years ago he had a stress test that was abnormal and also he had chest pain suggestive of angina which of all disorder since aggressive control of risk factors.  Hence I do not see a major contraindication for starting him on phentermine, we'll start him at 50 mg b.i.d. in the morning and p.m. dose and see whether he will respond and I will arrange for a virtual visit in 4 weeks.  Side effects of phentermine was discussed  in detail with the patient and his wife was also present during this virtual visit on the telephone.  His blood pressure is well controlled, lipids are also not well-controlled regimen.  Adrian Prows, MD, [facility] 06/27/2019, 9:11 PM [facility]. [place] Pager: [phone] Office: [phone] If no answer Cell [phone]

## 2019-08-09 ENCOUNTER — Encounter: Payer: Self-pay | Admitting: Cardiology

## 2019-08-10 ENCOUNTER — Telehealth: Payer: BC Managed Care – PPO | Admitting: Cardiology

## 2019-08-11 ENCOUNTER — Other Ambulatory Visit: Payer: Self-pay

## 2019-08-11 ENCOUNTER — Encounter: Payer: Self-pay | Admitting: Cardiology

## 2019-08-11 ENCOUNTER — Telehealth: Payer: BC Managed Care – PPO | Admitting: Cardiology

## 2019-08-11 VITALS — BP 125/76 | HR 65 | Ht 70.0 in | Wt 241.0 lb

## 2019-08-11 DIAGNOSIS — I10 Essential (primary) hypertension: Secondary | ICD-10-CM

## 2019-08-11 DIAGNOSIS — Z6835 Body mass index (BMI) 35.0-35.9, adult: Secondary | ICD-10-CM

## 2019-08-11 DIAGNOSIS — E559 Vitamin D deficiency, unspecified: Secondary | ICD-10-CM | POA: Diagnosis not present

## 2019-08-11 NOTE — Progress Notes (Signed)
Virtual Visit via Video Note: This visit type was conducted due to national recommendations for restrictions regarding the COVID-19 Pandemic (e.g. social distancing).  This format is felt to be most appropriate for this patient at this time.  All issues noted in this document were discussed and addressed.  No physical exam was performed (except for noted visual exam findings with Telehealth visits).  The patient has consented to conduct a Telehealth visit and understands insurance will be billed.   I connected with@, on 08/11/19 at  by a video enabled telemedicine application and verified that I am speaking with the correct person using two identifiers.   I discussed the limitations of evaluation and management by telemedicine and the availability of in person appointments. The patient expressed understanding and agreed to proceed.   I have discussed with patient regarding the safety during COVID Pandemic and steps and precautions to be taken including social distancing, frequent hand wash and use of detergent soap, gels with the patient. I asked the patient to avoid touching mouth, nose, eyes, ears with the hands. I encouraged regular walking around the neighborhood and exercise and regular diet, as long as social distancing can be maintained.   Primary Physician/Referring:  No primary care provider on file.  Patient ID: Jeff Brewer, male    DOB: Dec 09, 1961, 58 y.o.   MRN: [id number]  Chief Complaint  Patient presents with  . Hypertension    weight loss meds quesions   . Obesity    Patient prescribed phenteramine    HPI: Jeff Brewer  is a 58 y.o. male  with  hypertension, hyperlipidemia, hyperglycemia, prediabetes, mild obesity, family history of premature is CAD, gout, chronic corpulmonale and OSA on CPAP, Atypical chest pain with positive nuclear stress test in February 2016 revealing abnormal EKG response to treadmill stress test along with inferior apical ischemia, was started on  aggressive medical therapy including weight loss.    He had complete resolution of symptoms and with weight loss his labs improved.  He initially weighed greater than 275 pounds with a BMI of close to morbid obesity of 39. He tried [place] for weight loss, but finds it too expensive and has again gained weight.  As his risk factors were well controlled, I started him on phentermine 15 mg twice daily, patient has not been able to take this due to severe insomnia even taking 1 tablet once a day.  Otherwise no other specific complaints today.  Past Medical History:  Diagnosis Date  . Atypical chest pain   . Diabetes mellitus without complication ([place])   . Essential hypertension 02/07/2019  . Fatigue 02/08/2019  . Gout   . Hypercholesteremia   . Hyperglycemia 02/07/2019  . Hyperlipidemia 02/07/2019  . Hypertension   . Obesity    moderate  . Obstructive sleep apnea 02/08/2019   Resolved with weight loss  . OSA (obstructive sleep apnea)     Past Surgical History:  Procedure Laterality Date  . APPENDECTOMY     age 19  . ARTHROSCOPIC REPAIR ACL Left    1989  . ARTHROSCOPIC REPAIR ACL Right 1994    Social History   Socioeconomic History  . Marital status: Married    Spouse name: Ardelle Park  . Number of children: 1  . Years of education: Masters  . Highest education level: Not on file  Occupational History    Comment: [employer]  Social Needs  . Financial resource strain: Not on file  . Food insecurity  Worry: Not on file    Inability: Not on file  . Transportation needs    Medical: Not on file    Non-medical: Not on file  Tobacco Use  . Smoking status: Former Smoker    Types: Cigarettes  . Smokeless tobacco: Former Systems developer  . Tobacco comment: quit 20 years ago  Substance and Sexual Activity  . Alcohol use: Yes    Alcohol/week: 0.0 standard drinks    Comment: 4-5 drinks weekly  . Drug use: No  . Sexual activity: Not on file  Lifestyle  . Physical activity    Days per week:  Not on file    Minutes per session: Not on file  . Stress: Not on file  Relationships  . Social Herbalist on phone: Not on file    Gets together: Not on file    Attends religious service: Not on file    Active member of club or organization: Not on file    Attends meetings of clubs or organizations: Not on file    Relationship status: Not on file  . Intimate partner violence    Fear of current or ex partner: Not on file    Emotionally abused: Not on file    Physically abused: Not on file    Forced sexual activity: Not on file  Other Topics Concern  . Not on file  Social History Narrative   Patient consumes 1 cup of coffee am and pm    Review of Systems  Constitution: Positive for weight gain. Negative for chills, decreased appetite and malaise/fatigue.  Cardiovascular: Negative for dyspnea on exertion, leg swelling and syncope.  Endocrine: Negative for cold intolerance.  Hematologic/Lymphatic: Does not bruise/bleed easily.  Musculoskeletal: Positive for arthritis (bilateral knee). Negative for joint swelling.  Gastrointestinal: Negative for abdominal pain, anorexia, change in bowel habit, hematochezia and melena.  Neurological: Negative for headaches and light-headedness.  Psychiatric/Behavioral: Negative for depression and substance abuse.  All other systems reviewed and are negative.  Objective  Blood pressure 125/76, pulse 65, height '5\' 10"'$  (1.778 m), weight 241 lb (109.3 kg). Body mass index is 34.58 kg/m. Physical exam not performed or limited due to virtual visit.  Patient appeared to be in no distress, Neck was supple, respiration was not labored.  Please see exam details from prior visit is as below.   Physical Exam  Constitutional: He appears well-developed. No distress.  Mildly obese  HENT:  Head: Atraumatic.  Eyes: Conjunctivae are normal.  Neck: Neck supple. No JVD present. No thyromegaly present.  Cardiovascular: Normal rate, regular rhythm,  normal heart sounds and intact distal pulses. Exam reveals no gallop.  No murmur heard. Pulmonary/Chest: Effort normal and breath sounds normal.  Abdominal: Soft. Bowel sounds are normal.  Musculoskeletal: Normal range of motion.        General: No edema.  Neurological: He is alert.  Skin: Skin is warm and dry.  Psychiatric: He has a normal mood and affect.   Radiology: No results found.  Laboratory examination:   Labs 01/11/2019: Serum glucose 87 mg, BUN 16, creatinine 1.33, eGFR 68 mL.  08/06/2018: TSH 2.79.  07/29/2018: Hemoglobin A1c 5.7%.  Uric acid 6.9.  03/31/2018: Creatinine 1.27, EGFR 62/72, potassium 4.3, CMP normal.  Cholesterol 136, triglycerides 161, HDL 51, LDL 53.  Hemoglobin A1c 6.4%.   06/23/2017: Cholesterol 126, triglycerides 146, HDL 40, LDL 57.  Creatinine 1.2, potassium 4.3, CMP normal.  Uric acid normal at 7.5.  No flowsheet data found.  No flowsheet data found. Lipid Panel  No results found for: CHOL, TRIG, HDL, CHOLHDL, VLDL, LDLCALC, LDLDIRECT HEMOGLOBIN A1C No results found for: HGBA1C, MPG TSH No results for input(s): TSH in the last 8760 hours.  Medications   Current Outpatient Medications  Medication Instructions  . allopurinol (ZYLOPRIM) 100 mg, Oral, Daily  . ALPRAZolam (XANAX XR) 0.5 mg, Oral, Daily  . aspirin EC 81 mg, Oral, Daily  . colchicine 0.6 mg, Oral, Daily  . eplerenone (INSPRA) 50 MG tablet No dose, route, or frequency recorded.  . fexofenadine (ALLEGRA) 180 mg, Oral, Daily, As needed   . fluticasone (FLOVENT DISKUS) 50 MCG/BLIST diskus inhaler 1 puff, Inhalation, Daily, One spray each nostril daily as needed   . hydrALAZINE (APRESOLINE) 25 mg, Oral, Daily  . meloxicam (MOBIC) 15 mg, Oral, Daily  . METOPROLOL TARTRATE PO 100 mg, Oral, Daily  . NON FORMULARY Honey ginger with lime   . phentermine 15 mg, Oral, 2 times daily  . probenecid (BENEMID) 500 mg, Oral, Daily  . rosuvastatin (CRESTOR) 20 mg, Oral, Daily  . sildenafil  (VIAGRA) 100 mg, Oral, Daily PRN  . valsartan-hydrochlorothiazide (DIOVAN-HCT) 320-12.5 MG tablet TAKE ONE TABLET BY MOUTH EVERY MORNING  . verapamil (VERELAN PM) 180 MG 24 hr capsule TAKE 1 CAPSULE BY MOUTH EVERY EVENING AFTER DINNER  . Vitamin D (Ergocalciferol) (DRISDOL) 50,000 Units, Oral, Every 7 days   Cardiac Studies:   CARDIAC STUDIES:  Exercise sestamibi stress test 01/26/2015: 1. The resting electrocardiogram demonstrated normal sinus rhythm, normal resting conduction, no resting arrhythmias and normal rest repolarization. Low voltage.   The stress electrocardiogram revealed 1 mm of upsloping ST depression in lead (s):II, III, aVF, V5, V6 which was back to base at < 2 minutes into recovery. The patient performed treadmill exercise using a Bruce protocol, completing 7 minutes, an estimated workload of 7.3 METS. The heart rate was 90% MPHR. The stress test was terminated because of fatigue and dyspnea. 2. There is suggestion of very mild mid inferior to inferoapical ischemia. Gated SPECT images reveal normal myocardial thickening and wall motion.  The left ventricular ejection fraction was calculated or visually estimated to be 57%, normal.  Overall this represents a low risk study, clinical correlation recommended the patient weighs 270 pounds.  Echo- 01/30/15 1. Left ventricle cavity is normal in size. Mild to moderate concentric hypertrophy of the left ventricle. Normal global wall motion. Calculated EF 55%. 2. Right atrial cavity is slightly dilated. 3. Right ventricle cavity is slightly dilated. Normal right ventricular function. 4. Trace mitral regurgitation. 5. Trace tricuspid regurgitation. No evidence of pulmonary hypertension.  Assessment   Class 2 severe obesity due to excess calories with serious comorbidity and body mass index (BMI) of 35.0 to 35.9 in adult [facility])  Essential hypertension  Hypovitaminosis D   EKG 02/09/2019: Normal sinus rhythm at rate of 7 1 bpm, normal  axis.  No evidence of ischemia, normal EKG.  Baseline artifact.  Recommendations:   Patient was started on Saxenda, he did not see a change in weight but also was very expensive.  His blood pressure is well controlled, lipids are also not well-controlled regimen.  On his last virtual visit I started him on phentermine 15 mg twice daily for which he lost about 5 to 6 pounds but has had significant side effects and unable to sleep even when he reduce the medication to once a day.  As we are running out of options, he has tried Contrave in the past  as well, I have recommended bariatric surgery as a last resort.  Patient wants to try taking phentermine on a as needed basis which would be fine as well.  He request vitamin D prescription, advised him that he does not need to be on high-dose vitamin daily, to take 3000 units of vitamin D on a daily basis, with his next labs in 6 months prior to his visit I will Brewer vitamin D levels as well.  He does not have a PCP.  This was a 15-minutedirect face-to-face interaction encounter.   Adrian Prows, MD, [facility] 08/11/2019, 1:03 PM [facility]. [place] Pager: [phone] Office: [phone] If no answer Cell [phone]

## 2019-09-02 ENCOUNTER — Ambulatory Visit: Payer: BLUE CROSS/BLUE SHIELD | Admitting: Cardiology

## 2019-09-27 ENCOUNTER — Other Ambulatory Visit: Payer: Self-pay | Admitting: Cardiology

## 2020-01-05 ENCOUNTER — Other Ambulatory Visit: Payer: Self-pay | Admitting: Cardiology

## 2020-01-18 ENCOUNTER — Ambulatory Visit: Payer: BLUE CROSS/BLUE SHIELD | Admitting: Cardiology

## 2020-03-02 ENCOUNTER — Other Ambulatory Visit: Payer: Self-pay | Admitting: Cardiology

## 2020-03-28 ENCOUNTER — Ambulatory Visit: Payer: BC Managed Care – PPO | Admitting: Cardiology

## 2020-04-09 ENCOUNTER — Ambulatory Visit: Payer: BC Managed Care – PPO | Admitting: Cardiology

## 2020-04-09 ENCOUNTER — Other Ambulatory Visit: Payer: Self-pay

## 2020-04-09 ENCOUNTER — Encounter: Payer: Self-pay | Admitting: Cardiology

## 2020-04-09 VITALS — BP 142/89 | HR 74 | Temp 95.3°F | Resp 16 | Ht 70.0 in | Wt 245.0 lb

## 2020-04-09 DIAGNOSIS — I10 Essential (primary) hypertension: Secondary | ICD-10-CM

## 2020-04-09 DIAGNOSIS — E7800 Pure hypercholesterolemia, unspecified: Secondary | ICD-10-CM

## 2020-04-09 DIAGNOSIS — J3089 Other allergic rhinitis: Secondary | ICD-10-CM

## 2020-04-09 DIAGNOSIS — R739 Hyperglycemia, unspecified: Secondary | ICD-10-CM

## 2020-04-09 DIAGNOSIS — E6609 Other obesity due to excess calories: Secondary | ICD-10-CM

## 2020-04-09 MED ORDER — MOMETASONE FUROATE 50 MCG/ACT NA SUSP: 2.0000 | Freq: Every day | NASAL | 12 refills | Status: DC

## 2020-04-09 MED ORDER — METOPROLOL SUCCINATE ER 50 MG PO TB24: 50.0000 mg | ORAL_TABLET | Freq: Every day | ORAL | 3 refills | Status: DC

## 2020-04-09 MED ORDER — BUPROPION HCL ER (SR) 150 MG PO TB12: 150.0000 mg | ORAL_TABLET | Freq: Two times a day (BID) | ORAL | 6 refills | Status: DC

## 2020-04-09 NOTE — Progress Notes (Signed)
Primary Physician/Referring:  No primary care provider on file.  Patient ID: Jeff Brewer, male    DOB: 12/09/1961, 59 y.o.   MRN: [id number]  Chief Complaint  Patient presents with  . Hypertension  . Hyperlipidemia  . Follow-up    1 year   HPI: Jeff Brewer  is a 59 y.o. male  with  hypertension, hyperlipidemia, hyperglycemia, prediabetes, mild obesity, family history of premature is CAD, gout, chronic corpulmonale and OSA on CPAP, Atypical chest pain with positive nuclear stress test in February 2016 revealing abnormal EKG response to treadmill stress test along with inferior apical ischemia, was started on aggressive medical therapy including weight loss.    He had complete resolution of symptoms and with weight loss his labs improved.  He initially weighed greater than 275 pounds with a BMI of close to morbid obesity of 39. He has again gained weight and he is worried that he will be back to his original weight. No chest pain or dyspnea.  He is also contemplating left knee replacement in July 2021.  Past Medical History:  Diagnosis Date  . Atypical chest pain   . Diabetes mellitus without complication ([place])   . Essential hypertension 02/07/2019  . Fatigue 02/08/2019  . Gout   . Hypercholesteremia   . Hyperglycemia 02/07/2019  . Hyperlipidemia 02/07/2019  . Hypertension   . Obesity    moderate  . Obstructive sleep apnea 02/08/2019   Resolved with weight loss  . OSA (obstructive sleep apnea)     Past Surgical History:  Procedure Laterality Date  . APPENDECTOMY     age 71  . ARTHROSCOPIC REPAIR ACL Left    1989  . ARTHROSCOPIC REPAIR ACL Right 1994   Social History   Tobacco Use  . Smoking status: Former Smoker    Types: Cigarettes  . Smokeless tobacco: Former Systems developer  . Tobacco comment: quit 20 years ago  Substance Use Topics  . Alcohol use: Yes    Alcohol/week: 0.0 standard drinks    Comment: 4-5 drinks weekly   Marital Status: Married   Review of Systems    Cardiovascular: Negative for chest pain, dyspnea on exertion and leg swelling.  Musculoskeletal: Positive for arthritis (left knee).  Gastrointestinal: Negative for melena.    Objective  Blood pressure (!) 142/89, pulse 74, temperature (!) 95.3 F (35.2 C), temperature source Temporal, resp. rate 16, height '5\' 10"'$  (1.778 m), weight 245 lb (111.1 kg), SpO2 97 %. Body mass index is 35.15 kg/m.   Vitals with BMI 04/09/2020 08/11/2019 06/27/2019  Height '5\' 10"'$  '5\' 10"'$  '5\' 10"'$   Weight 245 lbs 241 lbs 244 lbs  BMI 35.15 80.99 83.38  Systolic 250 539 767  Diastolic 89 76 70  Pulse 74 65 82     Physical Exam  Constitutional: He appears well-developed. No distress.  Mildly obese  HENT:  Head: Atraumatic.  Eyes: Conjunctivae are normal.  Neck: No JVD present. No thyromegaly present.  Cardiovascular: Normal rate, regular rhythm, normal heart sounds and intact distal pulses. Exam reveals no gallop.  No murmur heard. Pulmonary/Chest: Effort normal and breath sounds normal.  Abdominal: Soft. Bowel sounds are normal.  Musculoskeletal:        General: No edema. Normal range of motion.     Cervical back: Neck supple.   Radiology: No results found.  Laboratory examination:   Labs 01/11/2019: Serum glucose 87 mg, BUN 16, creatinine 1.33, eGFR 68 mL.  08/06/2018: TSH 2.79.  07/29/2018: Hemoglobin A1c 5.7%.  Uric  acid 6.9.  03/31/2018: Creatinine 1.27, EGFR 62/72, potassium 4.3, CMP normal.  Cholesterol 136, triglycerides 161, HDL 51, LDL 53.  Hemoglobin A1c 6.4%.  06/23/2017: Cholesterol 126, triglycerides 146, HDL 40, LDL 57.  Creatinine 1.2, potassium 4.3, CMP normal.  Uric acid normal at 7.5.  No flowsheet data found. No flowsheet data found. Lipid Panel  No results found for: CHOL, TRIG, HDL, CHOLHDL, VLDL, LDLCALC, LDLDIRECT HEMOGLOBIN A1C No results found for: HGBA1C, MPG TSH No results for input(s): TSH in the last 8760 hours.  Medications   Current Outpatient Medications   Medication Instructions  . allopurinol (ZYLOPRIM) 100 mg, Oral, Daily  . aspirin EC 81 mg, Oral, Daily  . buPROPion (WELLBUTRIN SR) 150 mg, Oral, 2 times daily, AM and 3 pm.  . colchicine 0.6 mg, Oral, Daily  . eplerenone (INSPRA) 50 MG tablet No dose, route, or frequency recorded.  . fexofenadine (ALLEGRA) 180 mg, Oral, Daily, As needed   . fluticasone (FLOVENT DISKUS) 50 MCG/BLIST diskus inhaler 1 puff, Inhalation, Daily, One spray each nostril daily as needed   . hydrALAZINE (APRESOLINE) 25 MG tablet TAKE ONE TABLET BY MOUTH THREE TIMES A DAY AS DIRECTED  . meloxicam (MOBIC) 15 mg, Oral, Daily  . metoprolol succinate (TOPROL-XL) 50 mg, Oral, Daily, Take with or immediately following a meal.  . mometasone (NASONEX) 50 MCG/ACT nasal spray 2 sprays, Nasal, Daily  . NON FORMULARY Honey ginger with lime   . probenecid (BENEMID) 500 mg, Oral, Daily  . rosuvastatin (CRESTOR) 20 MG tablet TAKE ONE (1) TABLET BY MOUTH EACH DAY  . sildenafil (VIAGRA) 100 mg, Oral, Daily PRN  . valsartan-hydrochlorothiazide (DIOVAN-HCT) 320-12.5 MG tablet TAKE ONE TABLET BY MOUTH EVERY MORNING  . verapamil (VERELAN PM) 180 MG 24 hr capsule TAKE 1 CAPSULE BY MOUTH EVERY EVENING AFTER DINNER  . Vitamin D (Ergocalciferol) (DRISDOL) 50,000 Units, Oral, Every 7 days   Cardiac Studies:   CARDIAC STUDIES:  Exercise sestamibi stress test 01/26/2015: 1. The resting electrocardiogram demonstrated normal sinus rhythm, normal resting conduction, no resting arrhythmias and normal rest repolarization. Low voltage.   The stress electrocardiogram revealed 1 mm of upsloping ST depression in lead (s):II, III, aVF, V5, V6 which was back to base at < 2 minutes into recovery. The patient performed treadmill exercise using a Bruce protocol, completing 7 minutes, an estimated workload of 7.3 METS. The heart rate was 90% MPHR. The stress test was terminated because of fatigue and dyspnea. 2. There is suggestion of very mild mid inferior to  inferoapical ischemia. Gated SPECT images reveal normal myocardial thickening and wall motion.  The left ventricular ejection fraction was calculated or visually estimated to be 57%, normal.  Overall this represents a low risk study, clinical correlation recommended the patient weighs 270 pounds.  Echo- 01/30/15 1. Left ventricle cavity is normal in size. Mild to moderate concentric hypertrophy of the left ventricle. Normal global wall motion. Calculated EF 55%. 2. Right atrial cavity is slightly dilated. 3. Right ventricle cavity is slightly dilated. Normal right ventricular function. 4. Trace mitral regurgitation. 5. Trace tricuspid regurgitation. No evidence of pulmonary hypertension.  EKG:  EKG 04/09/2020: Normal sinus rhythm with rate of 70 bpm, left atrial enlargement, left axis deviation, left intrafascicular block.  No evidence of ischemia.    Assessment     ICD-10-CM   1. Essential hypertension  I10 EKG 12-Lead    metoprolol succinate (TOPROL-XL) 50 MG 24 hr tablet    CMP14+EGFR    TSH  2.  Pure hypercholesterolemia  E78.00 Lipid Panel With LDL/HDL Ratio  3. Hyperglycemia  R73.9 Hgb A1c w/o eAG  4. Class 2 obesity due to excess calories without serious comorbidity with body mass index (BMI) of 35.0 to 35.9 in adult  E66.09 buPROPion (WELLBUTRIN SR) 150 MG 12 hr tablet   Z68.35   5. Seasonal allergic rhinitis due to other allergic trigger  J30.89 mometasone (NASONEX) 50 MCG/ACT nasal spray    Recommendations:   Jeff Brewer  is a 59 y.o.  male  with  hypertension, hyperlipidemia, hyperglycemia, prediabetes, mild obesity, family history of premature is CAD, gout, chronic corpulmonale and OSA on CPAP, Atypical chest pain with positive nuclear stress test in February 2016 revealing abnormal EKG response to treadmill stress test along with inferior apical ischemia, was started on aggressive medical therapy including weight loss.    He has tried multiple medications for weight loss.   I had also tried him on phentermine which improved his appetite but I did not want to continue this long-term.  I would like to try Wellbutrin for weight loss.  His blood pressure is elevated, will start him on metoprolol succinate 50 mg daily.  He does not like to take frequent medication but is presently on hydralazine on a as needed basis for systolic blood pressure >568/61 mmHg.    Needs labs including lipids, TSH, A1c.  I will see him back in 3 months.  This was a 40-minute encounter with the patient and his wife explaining various ways in which she could lose weight, discussions regarding diet, discussion regarding hyperlipidemia and impact of weight on coronary disease and hypertension.  He does not have a PCP and I encouraged him to establish relationship with a PCP.  I refilled his prescription for Nasonex for seasonal allergy. Adrian Prows, MD, [facility] 04/09/2020, 9:38 PM [facility]. [place] Office: [phone]

## 2020-05-10 ENCOUNTER — Encounter: Payer: Self-pay | Admitting: Cardiology

## 2020-06-04 ENCOUNTER — Other Ambulatory Visit: Payer: Self-pay | Admitting: Cardiology

## 2020-06-04 ENCOUNTER — Other Ambulatory Visit: Payer: Self-pay

## 2020-06-04 MED ORDER — PROBENECID 500 MG PO TABS: 500.0000 mg | ORAL_TABLET | Freq: Every day | ORAL | 0 refills | Status: AC

## 2020-06-04 MED ORDER — ALLOPURINOL 100 MG PO TABS: 100.0000 mg | ORAL_TABLET | Freq: Every day | ORAL | 0 refills | Status: AC

## 2020-06-04 MED ORDER — ROSUVASTATIN CALCIUM 20 MG PO TABS: 20.0000 mg | ORAL_TABLET | Freq: Every day | ORAL | 0 refills | Status: DC

## 2020-06-09 LAB — LIPID PANEL WITH LDL/HDL RATIO
Cholesterol, Total: 126 mg/dL (ref 100–199)
HDL: 50 mg/dL (ref >39)
LDL Chol Calc (NIH): 54 mg/dL (ref 0–99)
LDL/HDL Ratio: 1.1 ratio (ref 0.0–3.6)
Triglycerides: 124 mg/dL (ref 0–149)
VLDL Cholesterol Cal: 22 mg/dL (ref 5–40)

## 2020-06-09 LAB — CMP14+EGFR
ALT: 34 IU/L (ref 0–44)
AST: 27 IU/L (ref 0–40)
Albumin/Globulin Ratio: 1.6 (ref 1.2–2.2)
Albumin: 4.6 g/dL (ref 3.8–4.9)
Alkaline Phosphatase: 90 IU/L (ref 48–121)
BUN/Creatinine Ratio: 15 (ref 9–20)
BUN: 19 mg/dL (ref 6–24)
Bilirubin Total: 0.4 mg/dL (ref 0.0–1.2)
CO2: 21 mmol/L (ref 20–29)
Calcium: 9.9 mg/dL (ref 8.7–10.2)
Chloride: 100 mmol/L (ref 96–106)
Creatinine, Ser: 1.3 mg/dL — ABNORMAL HIGH (ref 0.76–1.27)
GFR calc Af Amer: 69 mL/min/{1.73_m2} (ref >59)
GFR calc non Af Amer: 60 mL/min/{1.73_m2} (ref >59)
Globulin, Total: 2.8 g/dL (ref 1.5–4.5)
Glucose: 84 mg/dL (ref 65–99)
Potassium: 4.4 mmol/L (ref 3.5–5.2)
Sodium: 138 mmol/L (ref 134–144)
Total Protein: 7.4 g/dL (ref 6.0–8.5)

## 2020-06-09 LAB — HGB A1C W/O EAG: Hgb A1c MFr Bld: 5.8 % — ABNORMAL HIGH (ref 4.8–5.6)

## 2020-06-09 LAB — TSH: TSH: 2.87 u[IU]/mL (ref 0.450–4.500)

## 2020-06-18 ENCOUNTER — Encounter: Payer: Self-pay | Admitting: Cardiology

## 2020-06-18 ENCOUNTER — Other Ambulatory Visit: Payer: Self-pay

## 2020-06-18 ENCOUNTER — Ambulatory Visit: Payer: BC Managed Care – PPO | Admitting: Cardiology

## 2020-06-18 VITALS — BP 138/82 | HR 60 | Ht 70.0 in | Wt 247.0 lb

## 2020-06-18 DIAGNOSIS — E66812 Obesity, class 2: Secondary | ICD-10-CM

## 2020-06-18 DIAGNOSIS — E7800 Pure hypercholesterolemia, unspecified: Secondary | ICD-10-CM

## 2020-06-18 DIAGNOSIS — Z01810 Encounter for preprocedural cardiovascular examination: Secondary | ICD-10-CM

## 2020-06-18 DIAGNOSIS — Z6835 Body mass index (BMI) 35.0-35.9, adult: Secondary | ICD-10-CM

## 2020-06-18 DIAGNOSIS — I10 Essential (primary) hypertension: Secondary | ICD-10-CM

## 2020-06-18 NOTE — Progress Notes (Signed)
Primary Physician/Referring:  No primary care provider on file.  Patient ID: Jeff Brewer, male    DOB: 11-26-1961, 59 y.o.   MRN: [id number]  Chief Complaint  Patient presents with  . Hypertension  . Hyperlipidemia   HPI: Jeff Brewer  is a 59 y.o. male  with  hypertension, hyperlipidemia, hyperglycemia, prediabetes, mild obesity, family history of premature is CAD, gout, chronic corpulmonale and OSA on CPAP, Atypical chest pain with positive nuclear stress test in February 2016 revealing abnormal EKG response to treadmill stress test along with inferior apical ischemia, was started on aggressive medical therapy including weight loss.    He presents with a 6-week office visit and follow-up of hypertension, underwent lab testing as well.  On his last office visit had started him on metoprolol succinate which I have been taking it for 2 weeks he discontinued this as his blood pressure is now improved.  No chest pain or dyspnea.  He is scheduled for left knee replacement in 3 weeks.  Past Medical History:  Diagnosis Date  . Atypical chest pain   . Diabetes mellitus without complication ([place])   . Essential hypertension 02/07/2019  . Fatigue 02/08/2019  . Gout   . Hypercholesteremia   . Hyperglycemia 02/07/2019  . Hyperlipidemia 02/07/2019  . Hypertension   . Obesity    moderate  . Obstructive sleep apnea 02/08/2019   Resolved with weight loss  . OSA (obstructive sleep apnea)     Past Surgical History:  Procedure Laterality Date  . APPENDECTOMY     age 74  . ARTHROSCOPIC REPAIR ACL Left    1989  . ARTHROSCOPIC REPAIR ACL Right 1994   Social History   Tobacco Use  . Smoking status: Former Smoker    Types: Cigarettes  . Smokeless tobacco: Former Systems developer  . Tobacco comment: quit 20 years ago  Substance Use Topics  . Alcohol use: Yes    Alcohol/week: 0.0 standard drinks    Comment: 4-5 drinks weekly   Marital Status: Married   Review of Systems  Cardiovascular: Negative  for chest pain, dyspnea on exertion and leg swelling.  Musculoskeletal: Positive for arthritis (left knee).  Gastrointestinal: Negative for melena.    Objective  Blood pressure 138/82, pulse 60, height _0  (1.778 m), weight 247 lb (112 kg), SpO2 98 %. Body mass index is 35.44 kg/m.   Vitals with BMI 06/18/2020 06/18/2020 04/09/2020  Height - _1  _2   Weight - 247 lbs 245 lbs  BMI - 48.88 91.69  Systolic 450 388 828  Diastolic 82 88 89  Pulse 60 61 74     Physical Exam Constitutional:      General: He is not in acute distress.    Appearance: He is well-developed.     Comments: Mildly obese  Neck:     Thyroid: No thyromegaly.     Vascular: No JVD.  Cardiovascular:     Rate and Rhythm: Normal rate and regular rhythm.     Pulses: Intact distal pulses.     Heart sounds: Normal heart sounds. No murmur heard.  No gallop.   Pulmonary:     Effort: Pulmonary effort is normal.     Breath sounds: Normal breath sounds.  Abdominal:     General: Bowel sounds are normal.     Palpations: Abdomen is soft.  Musculoskeletal:     Cervical back: Neck supple.    Radiology: No results found.  Laboratory examination:   CMP Latest Ref Rng &  Units 06/08/2020  Glucose 65 - 99 mg/dL 84  BUN 6 - 24 mg/dL 19  Creatinine 0.76 - 1.27 mg/dL 1.30(H)  Sodium 134 - 144 mmol/L 138  Potassium 3.5 - 5.2 mmol/L 4.4  Chloride 96 - 106 mmol/L 100  CO2 20 - 29 mmol/L 21  Calcium 8.7 - 10.2 mg/dL 9.9  Total Protein 6.0 - 8.5 g/dL 7.4  Total Bilirubin 0.0 - 1.2 mg/dL 0.4  Alkaline Phos 48 - 121 IU/L 90  AST 0 - 40 IU/L 27  ALT 0 - 44 IU/L 34   No flowsheet data found. Lipid Panel     Component Value Date/Time   CHOL 126 06/08/2020 0901   TRIG 124 06/08/2020 0901   HDL 50 06/08/2020 0901   LDLCALC 54 06/08/2020 0901   HEMOGLOBIN A1C Lab Results  Component Value Date   HGBA1C 5.8 (H) 06/08/2020   TSH Recent Labs    06/08/20 0901  TSH 2.870     Labs 01/11/2019: Serum glucose 87  mg, BUN 16, creatinine 1.33, eGFR 68 mL.  08/06/2018: TSH 2.79.  07/29/2018: Hemoglobin A1c 5.7%.  Uric acid 6.9.  03/31/2018: Creatinine 1.27, EGFR 62/72, potassium 4.3, CMP normal.  Cholesterol 136, triglycerides 161, HDL 51, LDL 53.  Hemoglobin A1c 6.4%.  06/23/2017: Cholesterol 126, triglycerides 146, HDL 40, LDL 57.  Creatinine 1.2, potassium 4.3, CMP normal.  Uric acid normal at 7.5.  CMP Latest Ref Rng & Units 06/08/2020  Glucose 65 - 99 mg/dL 84  BUN 6 - 24 mg/dL 19  Creatinine 0.76 - 1.27 mg/dL 1.30(H)  Sodium 134 - 144 mmol/L 138  Potassium 3.5 - 5.2 mmol/L 4.4  Chloride 96 - 106 mmol/L 100  CO2 20 - 29 mmol/L 21  Calcium 8.7 - 10.2 mg/dL 9.9  Total Protein 6.0 - 8.5 g/dL 7.4  Total Bilirubin 0.0 - 1.2 mg/dL 0.4  Alkaline Phos 48 - 121 IU/L 90  AST 0 - 40 IU/L 27  ALT 0 - 44 IU/L 34   No flowsheet data found. Lipid Panel     Component Value Date/Time   CHOL 126 06/08/2020 0901   TRIG 124 06/08/2020 0901   HDL 50 06/08/2020 0901   LDLCALC 54 06/08/2020 0901   HEMOGLOBIN A1C Lab Results  Component Value Date   HGBA1C 5.8 (H) 06/08/2020   TSH Recent Labs    06/08/20 0901  TSH 2.870    Medications   Current Outpatient Medications  Medication Instructions  . allopurinol (ZYLOPRIM) 100 mg, Oral, Daily  . aspirin EC 81 mg, Oral, Daily  . buPROPion (WELLBUTRIN SR) 150 mg, Oral, 2 times daily, AM and 3 pm.  . colchicine 0.6 mg, Oral, Daily  . eplerenone (INSPRA) 50 MG tablet No dose, route, or frequency recorded.  . fexofenadine (ALLEGRA) 180 mg, Oral, Daily, As needed   . fluticasone (FLOVENT DISKUS) 50 MCG/BLIST diskus inhaler 1 puff, Inhalation, Daily, One spray each nostril daily as needed   . hydrALAZINE (APRESOLINE) 25 MG tablet TAKE ONE TABLET BY MOUTH THREE TIMES A DAY AS DIRECTED  . meloxicam (MOBIC) 15 mg, Oral, Daily  . metoprolol succinate (TOPROL-XL) 50 mg, Oral, Daily PRN, Take with or immediately following a meal.  . mometasone (NASONEX) 50 MCG/ACT  nasal spray 2 sprays, Nasal, Daily  . NON FORMULARY Honey ginger with lime   . probenecid (BENEMID) 500 mg, Oral, Daily  . rosuvastatin (CRESTOR) 20 mg, Oral, Daily  . sildenafil (VIAGRA) 100 mg, Oral, Daily PRN  . valsartan-hydrochlorothiazide (DIOVAN-HCT)  320-12.5 MG tablet TAKE ONE TABLET BY MOUTH EVERY MORNING  . verapamil (VERELAN PM) 180 MG 24 hr capsule TAKE 1 CAPSULE BY MOUTH EVERY EVENING AFTER DINNER  . Vitamin D (Ergocalciferol) (DRISDOL) 50,000 Units, Oral, Every 7 days   Cardiac Studies:   CARDIAC STUDIES:  Exercise sestamibi stress test 01/26/2015: 1. The resting electrocardiogram demonstrated normal sinus rhythm, normal resting conduction, no resting arrhythmias and normal rest repolarization. Low voltage.   The stress electrocardiogram revealed 1 mm of upsloping ST depression in lead (s):II, III, aVF, V5, V6 which was back to base at < 2 minutes into recovery. The patient performed treadmill exercise using a Bruce protocol, completing 7 minutes, an estimated workload of 7.3 METS. The heart rate was 90% MPHR. The stress test was terminated because of fatigue and dyspnea. 2. There is suggestion of very mild mid inferior to inferoapical ischemia. Gated SPECT images reveal normal myocardial thickening and wall motion.  The left ventricular ejection fraction was calculated or visually estimated to be 57%, normal.  Overall this represents a low risk study, clinical correlation recommended the patient weighs 270 pounds.  Echo- 01/30/15 1. Left ventricle cavity is normal in size. Mild to moderate concentric hypertrophy of the left ventricle. Normal global wall motion. Calculated EF 55%. 2. Right atrial cavity is slightly dilated. 3. Right ventricle cavity is slightly dilated. Normal right ventricular function. 4. Trace mitral regurgitation. 5. Trace tricuspid regurgitation. No evidence of pulmonary hypertension.  EKG:  EKG 04/09/2020: Normal sinus rhythm with rate of 70 bpm, left atrial  enlargement, left axis deviation, left intrafascicular block.  No evidence of ischemia.    Assessment     ICD-10-CM   1. Essential hypertension  I10 metoprolol succinate (TOPROL-XL) 50 MG 24 hr tablet  2. Pure hypercholesterolemia  E78.00   3. Class 2 severe obesity due to excess calories with serious comorbidity and body mass index (BMI) of 35.0 to 35.9 in adult ([facility])  E66.01    Z68.35   4. Pre-operative cardiovascular examination  Z01.810     Recommendations:   Jeff Brewer  is a 59 y.o.  male  with  hypertension, hyperlipidemia, hyperglycemia, prediabetes, mild obesity, family history of premature is CAD, gout, chronic corpulmonale and OSA on CPAP, Atypical chest pain with positive nuclear stress test in February 2016 revealing abnormal EKG response to treadmill stress test along with inferior apical ischemia, was started on aggressive medical therapy including weight loss.    On his last office visit due to uncontrolled hypertension, I started him on metoprolol succinate, however after he started to relax and also with some weight loss, he started having significant dizziness and he is now taking it on a as needed basis only.  Today the blood pressure is well controlled.  He also takes hydralazine on a as needed basis.  I reviewed his labs, lipids are under excellent control.  A1c now has reached the level of diabetes mellitus, however he is now making lifestyle changes to lose weight again.  Hopefully this will improve.  He will need continued surveillance of the same, again advised him to establish with a PCP.  I will see him back in 6 months for follow-up.  He is scheduled for left knee replacement in 3 weeks, will forward a copy of my clearance letter to his surgeon Gaynelle Arabian.   Adrian Prows, MD, [facility] 06/18/2020, 2:28 PM [facility]. [place] Office: [phone]

## 2020-08-29 ENCOUNTER — Other Ambulatory Visit: Payer: Self-pay | Admitting: Cardiology

## 2020-10-19 ENCOUNTER — Telehealth: Payer: Self-pay

## 2020-10-19 DIAGNOSIS — R200 Anesthesia of skin: Secondary | ICD-10-CM

## 2020-10-19 NOTE — Telephone Encounter (Signed)
Patient had called me about right arm tingling and numbness and also weakness in his fingers, I have made a referral for neurology evaluation with Dr. Shon Millet.    ICD-10-CM   1. Numbness and tingling of right arm  R20.0 Ambulatory referral to Neurology   R20.2      Jeff Decamp, MD, [facility] 10/19/2020, 2:21 PM Office: [phone] Pager: [phone]

## 2020-10-22 ENCOUNTER — Other Ambulatory Visit: Payer: Self-pay

## 2020-10-22 ENCOUNTER — Ambulatory Visit (INDEPENDENT_AMBULATORY_CARE_PROVIDER_SITE_OTHER): Payer: BC Managed Care – PPO | Admitting: Neurology

## 2020-10-22 ENCOUNTER — Encounter: Payer: Self-pay | Admitting: Neurology

## 2020-10-22 VITALS — BP 150/80 | HR 74 | Resp 20 | Ht 60.0 in | Wt 248.0 lb

## 2020-10-22 DIAGNOSIS — I10 Essential (primary) hypertension: Secondary | ICD-10-CM | POA: Diagnosis not present

## 2020-10-22 DIAGNOSIS — R292 Abnormal reflex: Secondary | ICD-10-CM | POA: Diagnosis not present

## 2020-10-22 DIAGNOSIS — R200 Anesthesia of skin: Secondary | ICD-10-CM | POA: Diagnosis not present

## 2020-10-22 NOTE — Patient Instructions (Signed)
My feeling is that the symptoms are coming from the spine in the neck.  We will check MRI of cervical spine.  Further recommendations pending results.  After evaluation of MRI, will send to occupational therapy.

## 2020-10-22 NOTE — Progress Notes (Signed)
NEUROLOGY CONSULTATION NOTE  Jeff Brewer MRN: [id number] DOB: [birth year]  Referring provider: Yates Decamp, MD Primary care provider: No PCP  Reason for consult:  Right arm numbness and tingling  HISTORY OF PRESENT ILLNESS: Jeff Brewer is a 59 year old right-handed male with HTN, DM2, HLD who presents for right arm numbness and weakness.    I spoke with Dr. Jacinto Halim via phone regarding patient. Three months ago, he underwent left knee replacement.  Immediately afterwards, he noticed numbness in the finger tips of the first 3 digits of both hands.  About 4 weeks ago, he developed complete numbness of the first 3 digits of the right hand, radiating up the lateral arm to the shoulder.  He also reports pain in the thenar aspect of his left hand.  No weakness but due to the numbness, he cannot use his hand such as typing and writing.  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  He had a NCV-EMG performed on 10/01/2020 which was normal.  He underwent a carpal tunnel steroid injection which was ineffective.  [name]  06/08/2020 LABS:  CMP with Na 138, K 4.4, Cl 100, CO2 21, Ca 9.9, glucose 84, BUN 19, Cr 1.30, t bili 0.4, ALP 90, AST 27, ALT 34; lipid panel with t chol 126, TG 134, HDL 50, LDL 54; TSH 2.870; Hgb A1c 5.8.  PAST MEDICAL HISTORY: Past Medical History:  Diagnosis Date  . Atypical chest pain   . Diabetes mellitus without complication (HCC)   . Essential hypertension 02/07/2019  . Fatigue 02/08/2019  . Gout   . Hypercholesteremia   . Hyperglycemia 02/07/2019  . Hyperlipidemia 02/07/2019  . Hypertension   . Obesity    moderate  . Obstructive sleep apnea 02/08/2019   Resolved with weight loss  . OSA (obstructive sleep apnea)     PAST SURGICAL HISTORY: Past Surgical History:  Procedure Laterality Date  . APPENDECTOMY     age 58  . ARTHROSCOPIC REPAIR ACL Left    1989  . ARTHROSCOPIC REPAIR ACL Right 1994    MEDICATIONS: Current  Outpatient Medications on File Prior to Visit  Medication Sig Dispense Refill  . allopurinol (ZYLOPRIM) 100 MG tablet Take 1 tablet (100 mg total) by mouth daily. 90 tablet 0  . aspirin EC 81 MG tablet Take 81 mg by mouth daily.    [name] buPROPion (WELLBUTRIN SR) 150 MG 12 hr tablet Take 1 tablet (150 mg total) by mouth in the morning and at bedtime. AM and 3 pm. 60 tablet 6  . colchicine 0.6 MG tablet Take 0.6 mg by mouth daily.    [name] eplerenone (INSPRA) 50 MG tablet   0  . fexofenadine (ALLEGRA) 180 MG tablet Take 180 mg by mouth daily. As needed    . fluticasone (FLOVENT DISKUS) 50 MCG/BLIST diskus inhaler Inhale 1 puff into the lungs daily. One spray each nostril daily as needed    . hydrALAZINE (APRESOLINE) 25 MG tablet Take 1 tablet (25 mg total) by mouth 3 (three) times daily as needed. For SBP >140/80 mm Hg 180 tablet 0  . meloxicam (MOBIC) 15 MG tablet Take 15 mg by mouth daily.    . metoprolol succinate (TOPROL-XL) 50 MG 24 hr tablet Take 1 tablet (50 mg total) by mouth daily as needed (For SBP >130 mm Hg). Take with or immediately following a meal. 90 tablet 3  . mometasone (NASONEX) 50 MCG/ACT nasal spray Place 2 sprays  into the nose daily. 17 g 12  . NON FORMULARY Honey ginger with lime    . probenecid (BENEMID) 500 MG tablet Take 1 tablet (500 mg total) by mouth daily. 90 tablet 0  . rosuvastatin (CRESTOR) 20 MG tablet Take 1 tablet (20 mg total) by mouth daily. 90 tablet 0  . sildenafil (VIAGRA) 100 MG tablet Take 100 mg by mouth daily as needed for erectile dysfunction.    . valsartan-hydrochlorothiazide (DIOVAN-HCT) 320-12.5 MG tablet TAKE ONE TABLET BY MOUTH EVERY MORNING 90 tablet 3  . verapamil (VERELAN PM) 180 MG 24 hr capsule TAKE 1 CAPSULE BY MOUTH EVERY EVENING AFTER DINNER 90 capsule 2  . Vitamin D, Ergocalciferol, (DRISDOL) 50000 UNITS CAPS capsule Take 50,000 Units by mouth every 7 (seven) days.     No current facility-administered medications on file prior to visit.     ALLERGIES: Allergies  Allergen Reactions  . Aspirin Rash  . Sulfa Antibiotics Rash    FAMILY HISTORY: Family History  Problem Relation Age of Onset  . Heart Problems Mother   . Heart Problems Father        known hole in heart    SOCIAL HISTORY: Social History   Socioeconomic History  . Marital status: Married    Spouse name: Loma Newton  . Number of children: 1  . Years of education: Masters  . Highest education level: Not on file  Occupational History    Comment: [employer]  Tobacco Use  . Smoking status: Former Smoker    Types: Cigarettes  . Smokeless tobacco: Former Neurosurgeon  . Tobacco comment: quit 20 years ago  Substance and Sexual Activity  . Alcohol use: Yes    Alcohol/week: 0.0 standard drinks    Comment: 4-5 drinks weekly  . Drug use: No  . Sexual activity: Not on file  Other Topics Concern  . Not on file  Social History Narrative   Patient consumes 1 cup of coffee am and pm   Social Determinants of Health   Financial Resource Strain:   . Difficulty of Paying Living Expenses: Not on file  Food Insecurity:   . Worried About Programme researcher, broadcasting/film/video in the Last Year: Not on file  . Ran Out of Food in the Last Year: Not on file  Transportation Needs:   . Lack of Transportation (Medical): Not on file  . Lack of Transportation (Non-Medical): Not on file  Physical Activity:   . Days of Exercise per Week: Not on file  . Minutes of Exercise per Session: Not on file  Stress:   . Feeling of Stress : Not on file  Social Connections:   . Frequency of Communication with Friends and Family: Not on file  . Frequency of Social Gatherings with Friends and Family: Not on file  . Attends Religious Services: Not on file  . Active Member of Clubs or Organizations: Not on file  . Attends Banker Meetings: Not on file  . Marital Status: Not on file  Intimate Partner Violence:   . Fear of Current or Ex-Partner: Not on file  . Emotionally Abused: Not on file   . Physically Abused: Not on file  . Sexually Abused: Not on file    PHYSICAL EXAM: Blood pressure (!) 150/80, pulse 74, resp. rate 20, height 5' (1.524 m), weight 248 lb (112.5 kg), SpO2 95 %. General: No acute distress.  Patient appears well-groomed.  Head:  Normocephalic/atraumatic Eyes:  fundi examined but not visualized Neck: supple, no  paraspinal tenderness, full range of motion Back: No paraspinal tenderness Heart: regular rate and rhythm Lungs: Clear to auscultation bilaterally. Vascular: No carotid bruits. Neurological Exam: Mental status: alert and oriented to person, place, and time, recent and remote memory intact, fund of knowledge intact, attention and concentration intact, speech fluent and not dysarthric, language intact. Cranial nerves: CN I: not tested CN II: pupils equal, round and reactive to light, visual fields intact CN III, IV, VI:  full range of motion, no nystagmus, no ptosis CN V: facial sensation intact CN VII: upper and lower face symmetric CN VIII: hearing intact CN IX, X: gag intact, uvula midline CN XI: sternocleidomastoid and trapezius muscles intact CN XII: tongue midline Bulk & Tone: normal, no fasciculations. Motor:  5/5 throughout  Sensation:  Pinprick and light touch sensation reduced in first 3 digits of right hand, thenar aspect of right hand and lateral right arm up to shoulder; and vibratory sensation intact. Deep Tendon Reflexes:  + throughout,  toes downgoing. Finger to nose testing:  Without dysmetria.   Heel to shin:  Without dysmetria.   Gait:  Normal station and stride.  Able to turn and tandem walk. Romberg negative. Tinel's sign negative.  IMPRESSION: 1.  Right arm and hand numbness.  It appears to be in the distribution of the median nerve or C6-C7 nerve roots.  NCV was negative but performed only a week or so after onset of symptoms.  However, carpal tunnel injection ineffective.  Therefore, I would look for a cervical  radiculopathy.  He also exhibits bilateral hyperreflexia.  Although this may be normal, it could represent a myelopathy.  I would check MRI of cervical spine to evaluate for a disc protrusion compressing the right C6-7 nerve roots and possibly spinal cord as well. 2.  Elevated blood pressure.  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  MRI of cervical spine without contrast 2.  Will likely need occupational therapy vs referral to surgery.  Further recommendations pending results.  Thank you for allowing me to take part in the care of this patient.  Shon Millet, DO  CC:  Yates Decamp, MD

## 2020-10-25 ENCOUNTER — Ambulatory Visit
Admission: RE | Admit: 2020-10-25 | Discharge: 2020-10-25 | Disposition: A | Discharge status: Home / Self Care | Payer: BC Managed Care – PPO | Source: Ambulatory Visit | Attending: Neurology | Admitting: Neurology

## 2020-10-25 DIAGNOSIS — R200 Anesthesia of skin: Secondary | ICD-10-CM

## 2020-10-25 DIAGNOSIS — R292 Abnormal reflex: Secondary | ICD-10-CM

## 2020-10-25 IMAGING — MR MR CERVICAL SPINE W/O CM
4 of 5 series · 27 of 48 positions shown · non-contrast
Comparison: None.

CLINICAL DATA: right upper extremixty, hyperflexia. Right arm pain
and hand numbness.

EXAM:
MRI CERVICAL SPINE WITHOUT CONTRAST
TECHNIQUE: Multiplanar, multisequence MR imaging of the cervical spine was
performed. No intravenous contrast was administered.

[Series 3: T2 · sagittal · 3.0mm · 0.66mm/px · 6 of 14 slices shown (1 of 2)]
[im 1/14]
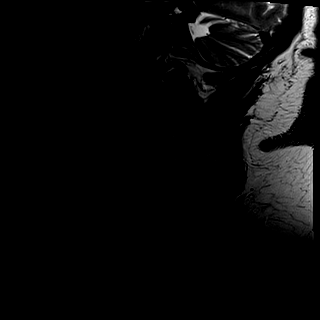
[im 3/14]
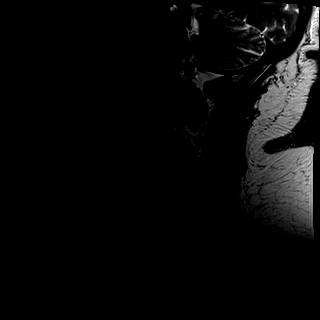
[im 6/14]
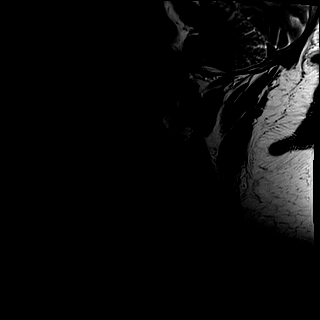
[im 8/14]
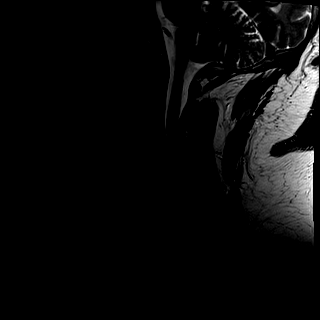
[im 11/14]
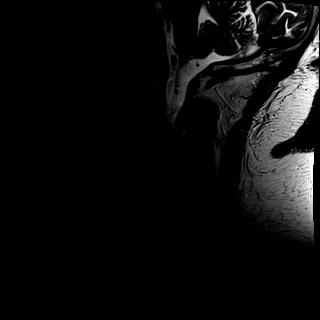
[im 14/14]
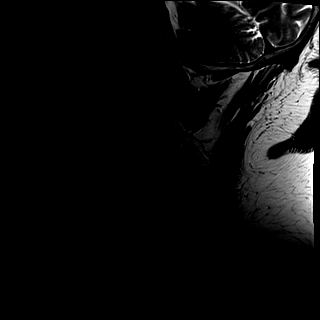

[Series 4: T1 · sagittal · 3.0mm · 0.41mm/px · 7 of 14 slices shown]
[im 1/14]
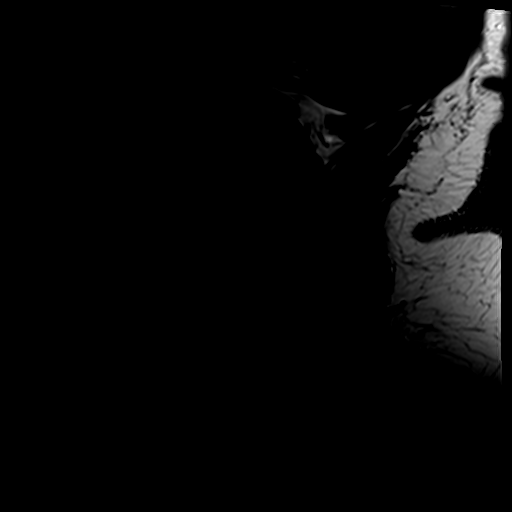
[im 3/14]
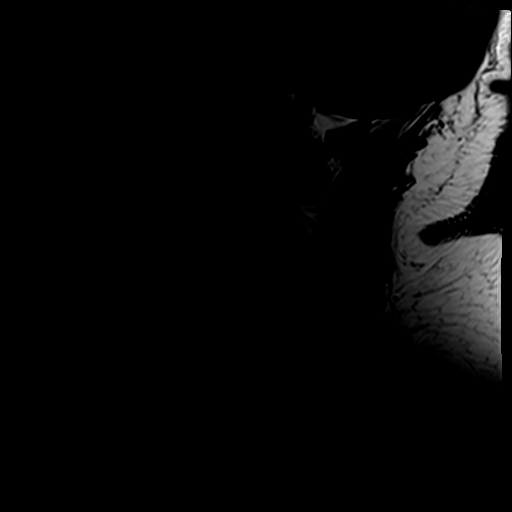
[im 5/14]
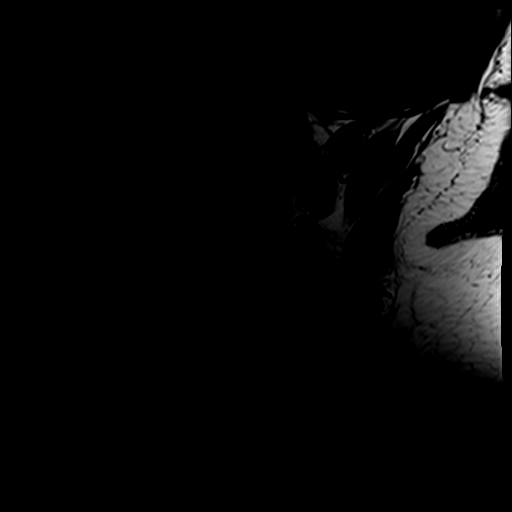
[im 7/14]
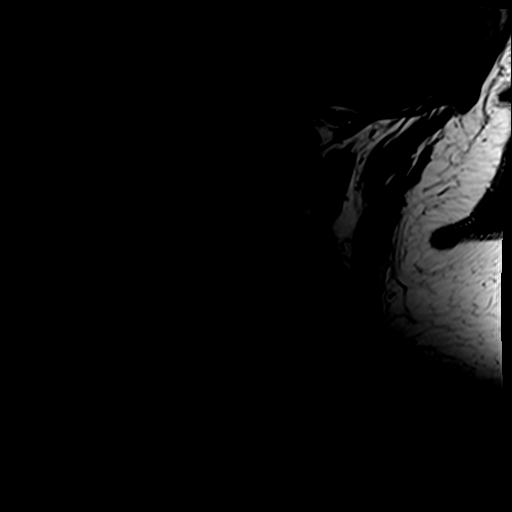
[im 9/14]
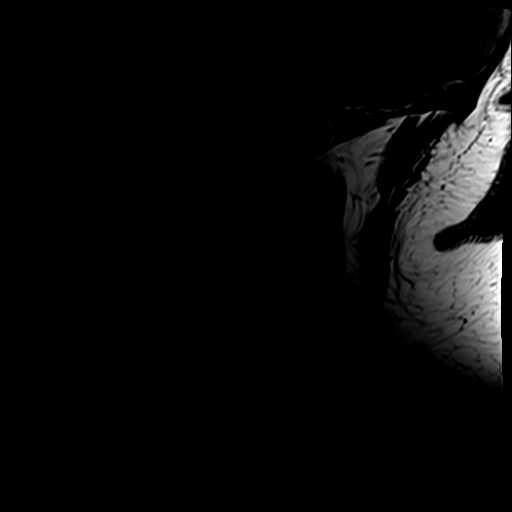
[im 11/14]
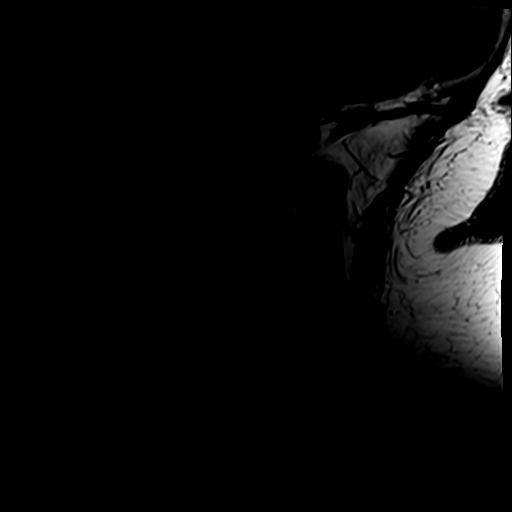
[im 14/14]
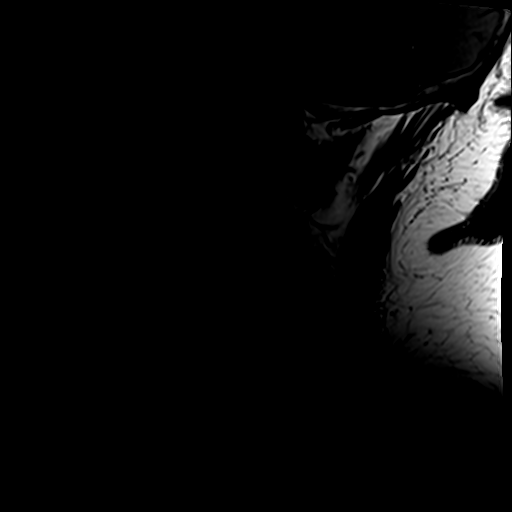

[Series 5: tir sag · sagittal · 3.0mm · 0.41mm/px · 6 of 14 slices shown]
[im 1/14]
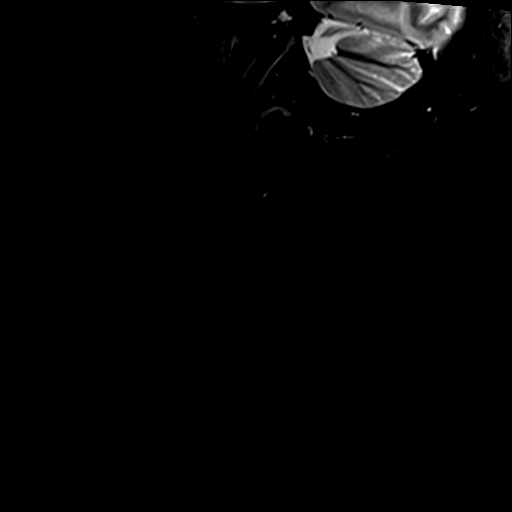
[im 3/14]
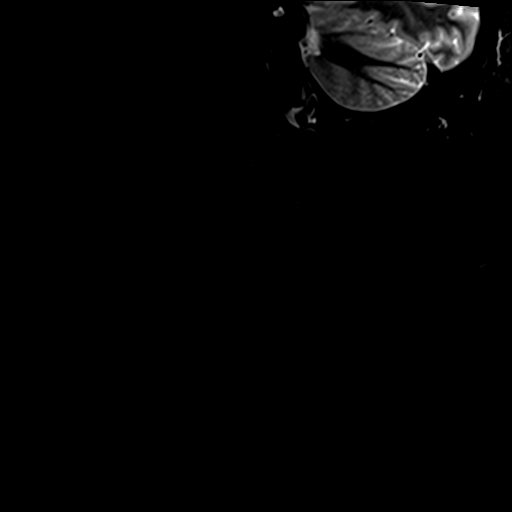
[im 5/14]
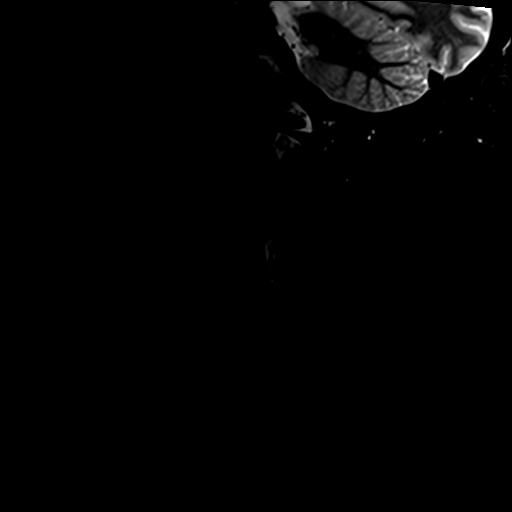
[im 7/14]
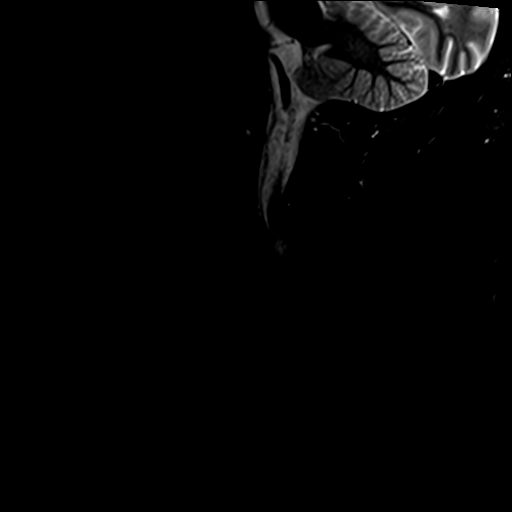
[im 9/14]
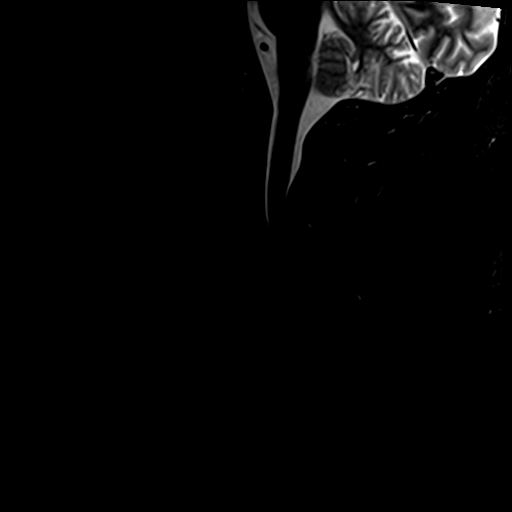
[im 11/14]
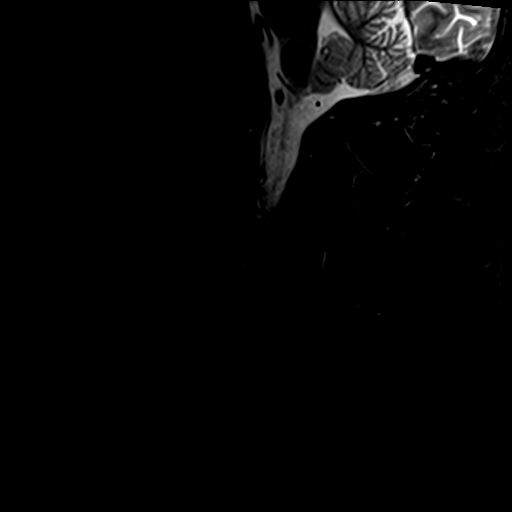

[Series 7: T2 · axial · 3.0mm · 0.70mm/px · z∈[-70,+35]mm · 8 of 30 slices shown (2 of 2)]
[im 1/30]
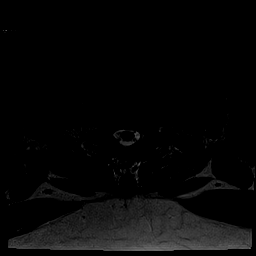
[im 5/30]
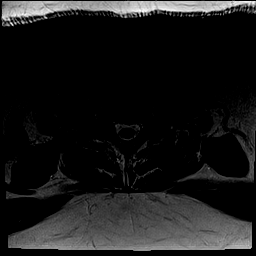
[im 9/30]
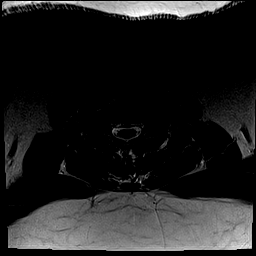
[im 14/30]
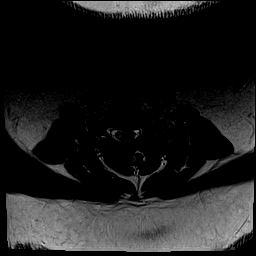
[im 16/30]
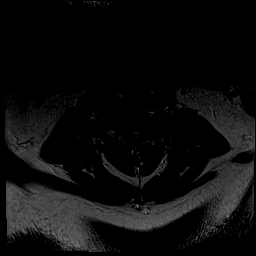
[im 21/30]
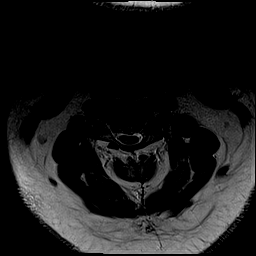
[im 25/30]
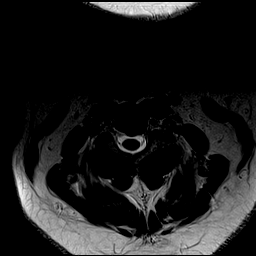
[im 30/30]
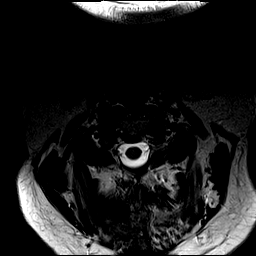

[27 of 48 positions shown; findings below may reference images not displayed]

FINDINGS: Alignment: Straightening of lordosis. Stepwise minimal grade 1 C3-4
and C4-5 anterolisthesis. Grade 1 C5-6 retrolisthesis. Grade 1 C7-T1
anterolisthesis.

Vertebrae: Multilevel Modic type 2 endplate degenerative changes.
T1/T2 hyperintense foci reflect hemangiomata versus focal fat.

Cord: T2 hyperintense cord signal spanning the C4-6 levels ([DATE]).
Focal right hemicord T2 hyperintensity measuring 5 mm ([DATE]) at the
C4 level.

Posterior Fossa, vertebral arteries: Negative.

Disc levels: Multilevel desiccation and disc space loss most
prominent at the C4-7 levels.

C2-3: Small central protrusion with bilateral uncovertebral/facet
hypertrophy. Patent spinal canal and right neural foramen. Mild left
neural foraminal narrowing.

C3-4: Disc osteophyte complex with shallow central protrusion
abutting the ventral cord, uncovertebral and facet hypertrophy.
Mild-to-moderate spinal canal, moderate right and severe left neural
foraminal narrowing.

C4-5: Disc osteophyte complex with uncovertebral and facet
hypertrophy. Partial effacement of the ventral CSF containing
spaces. Mild spinal canal, mild right and severe left neural
foraminal narrowing.

C5-6: Disc osteophyte complex abutting the ventral cord.
Superimposed central protrusion with uncovertebral and facet
hypertrophy. Moderate to severe spinal canal and bilateral neural
foraminal narrowing.

C6-7: Disc osteophyte complex with superimposed central protrusion
abutting the ventral cord. Uncovertebral and bilateral facet
hypertrophy. Mild-to-moderate spinal canal, severe right and
moderate left neural foraminal narrowing.

C7-T1: Disc osteophyte complex with central protrusion abutting the
ventral cord. Uncovertebral and facet hypertrophy. Prominent
ligamentum flavum. Mild spinal canal and bilateral neural foraminal
narrowing.

Paraspinal tissues: Negative.
IMPRESSION: Multilevel spondylosis. Moderate to severe C5-6 and mild to moderate
C3-5, C6-7 spinal canal narrowing.

Moderate to severe bilateral C3-4, left C4-5, bilateral C5-7 neural
foraminal narrowing.

C4-6 myelomalacia. Focal cord hyperintensity at the C4 level.
Consider postcontrast imaging to exclude demyelinating lesion.

These results will be called to the ordering clinician or
representative by the Radiologist Assistant, and communication
documented in the PACS or [REDACTED].

## 2020-10-27 ENCOUNTER — Other Ambulatory Visit: Payer: BC Managed Care – PPO

## 2020-10-29 ENCOUNTER — Telehealth: Payer: Self-pay | Admitting: Neurology

## 2020-10-29 DIAGNOSIS — R292 Abnormal reflex: Secondary | ICD-10-CM

## 2020-10-29 DIAGNOSIS — R200 Anesthesia of skin: Secondary | ICD-10-CM

## 2020-10-29 NOTE — Telephone Encounter (Signed)
MRI Cervical spine ordered per DR. Jaffe.   Chelsea when you get a chance can you start the PA for this.

## 2020-10-29 NOTE — Telephone Encounter (Signed)
Patient called Sheena back and requested the order to go to [facility] ASAP. He states "this is an emergency"

## 2020-10-29 NOTE — Telephone Encounter (Signed)
Telephone call to pt, Advised pt we will add the order right now.  Pt wanted to know if he could have it done somewhere else.   Advised pt to call me and let me know where and I will add the order for that place.

## 2020-10-29 NOTE — Telephone Encounter (Signed)
Patient called in and left a message that he tried to schedule MRI with [facility], but they did not have the order yet. Can someone put it in and give him a call to let him know?

## 2020-10-30 ENCOUNTER — Other Ambulatory Visit: Payer: Self-pay

## 2020-10-30 ENCOUNTER — Other Ambulatory Visit: Payer: Self-pay | Admitting: Neurology

## 2020-10-30 ENCOUNTER — Ambulatory Visit
Admission: RE | Admit: 2020-10-30 | Discharge: 2020-10-30 | Disposition: A | Discharge status: Home / Self Care | Payer: BC Managed Care – PPO | Source: Ambulatory Visit | Attending: Neurology | Admitting: Neurology

## 2020-10-30 DIAGNOSIS — R200 Anesthesia of skin: Secondary | ICD-10-CM

## 2020-10-30 DIAGNOSIS — R292 Abnormal reflex: Secondary | ICD-10-CM

## 2020-10-30 IMAGING — MR MR CERVICAL SPINE W/ CM
3 series · 21 of 48 positions shown · IV contrast (20 ml multihance)
Comparison: MRI of the cervical spine [DATE].

CLINICAL DATA: Spinal stenosis. C-spine right upper extremity
hyperreflexia. Right arm pain and numbness.

EXAM:
MRI CERVICAL SPINE WITH CONTRAST
TECHNIQUE: Multiplanar, multisequence MR imaging of the cervical spine was
performed following the administration of intravenous contrast.

[Series 5: T1 · axial · non-contrast · 3.0mm · 0.31mm/px · z∈[-38,+69]mm · 9 of 38 slices shown (1 of 2)]
[im 2/38]
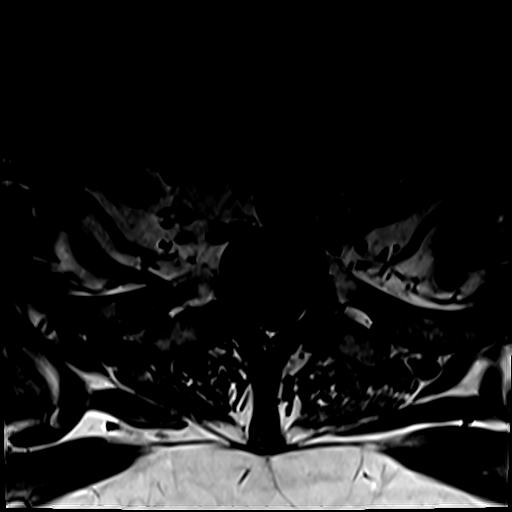
[im 6/38]
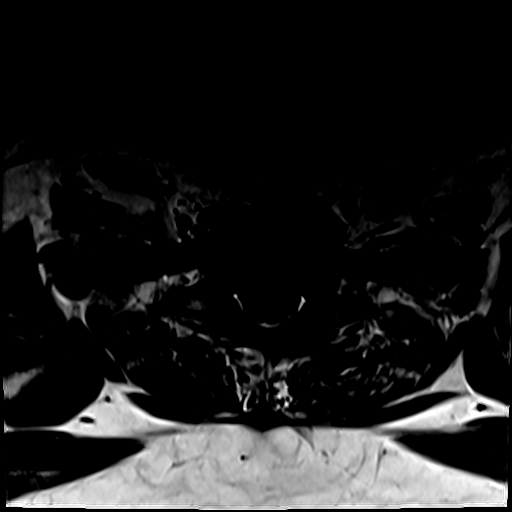
[im 12/38]
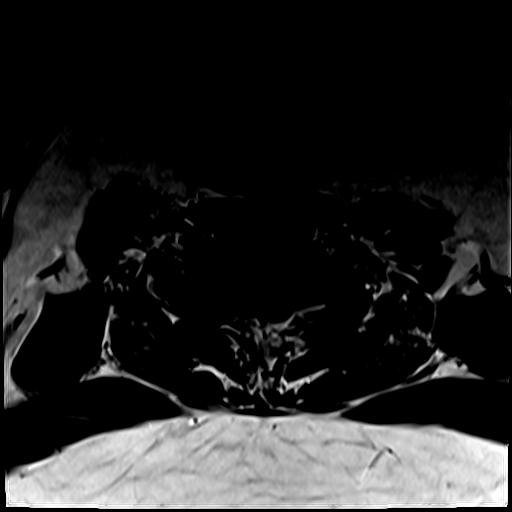
[im 16/38]
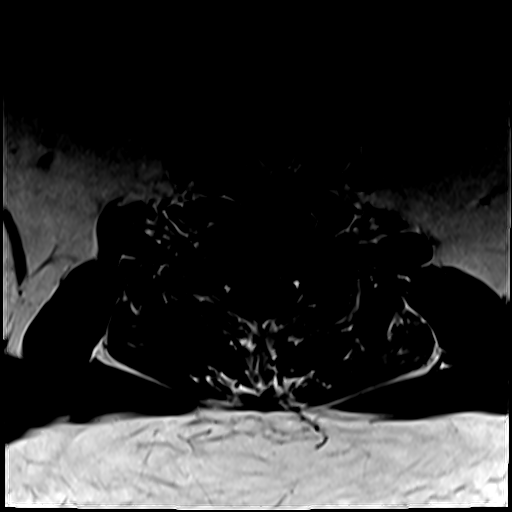
[im 20/38]
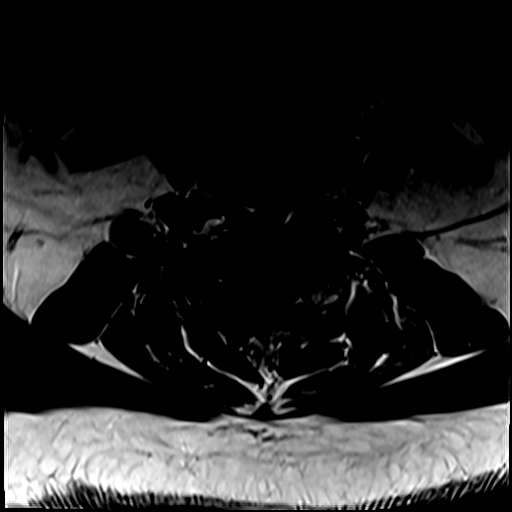
[im 22/38]
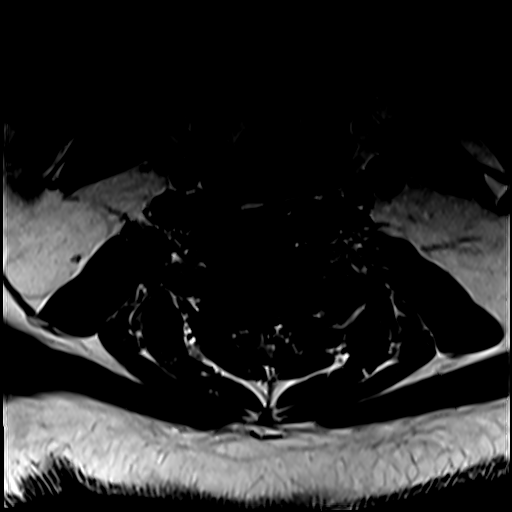
[im 26/38]
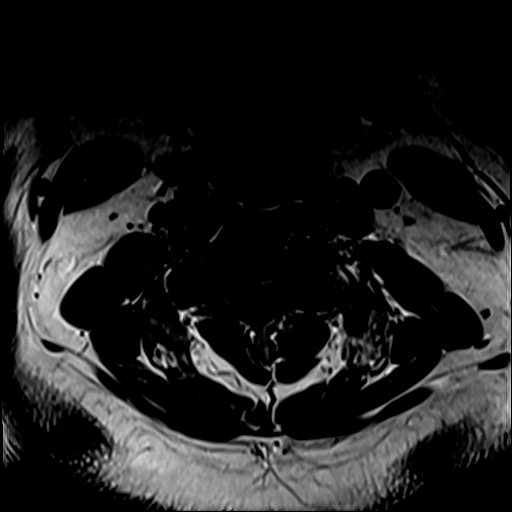
[im 32/38]
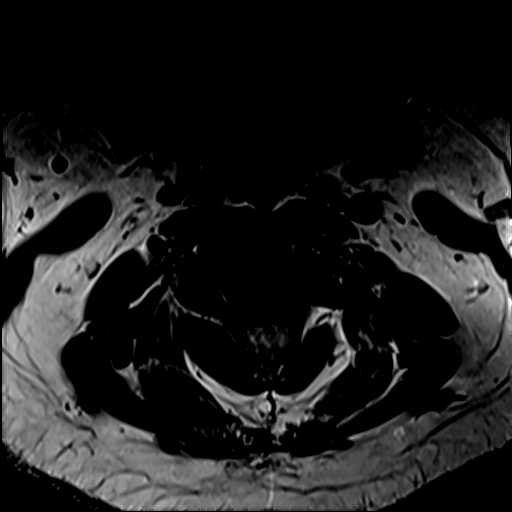
[im 36/38]
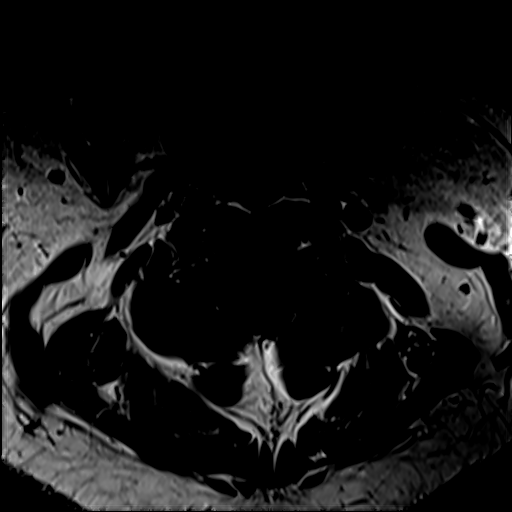

[Series 6: T1 post-contrast · sagittal · 3.0mm · 0.66mm/px · 3 of 15 slices shown]
[im 3/15]
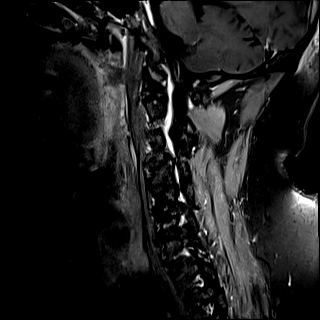
[im 9/15]
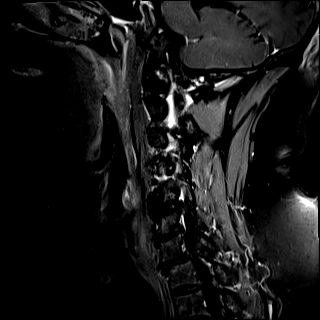
[im 13/15]
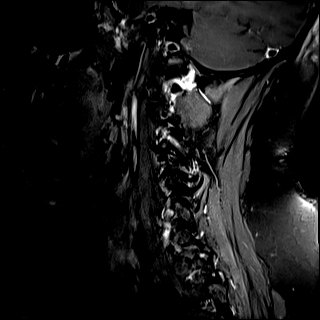

[Series 7: T1 · axial · 3.0mm · 0.31mm/px · z∈[-38,+69]mm · 9 of 38 slices shown (2 of 2)]
[im 2/38]
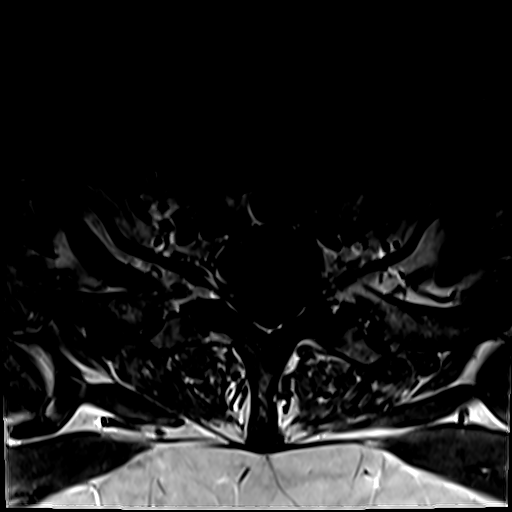
[im 6/38]
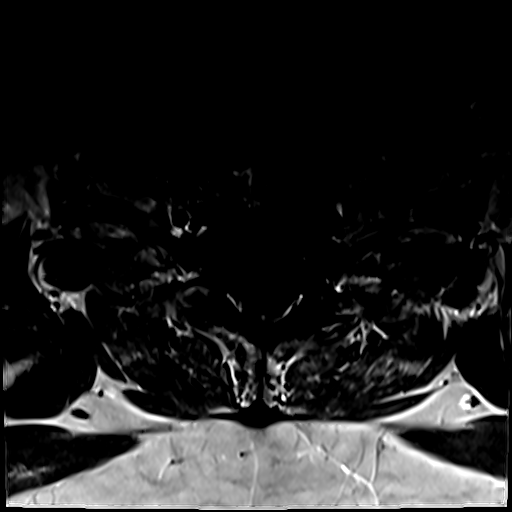
[im 12/38]
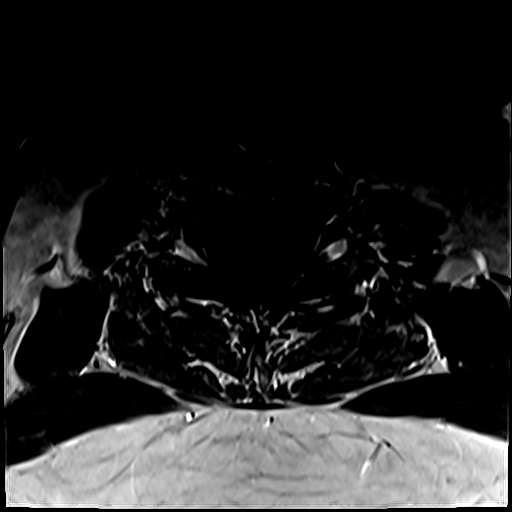
[im 16/38]
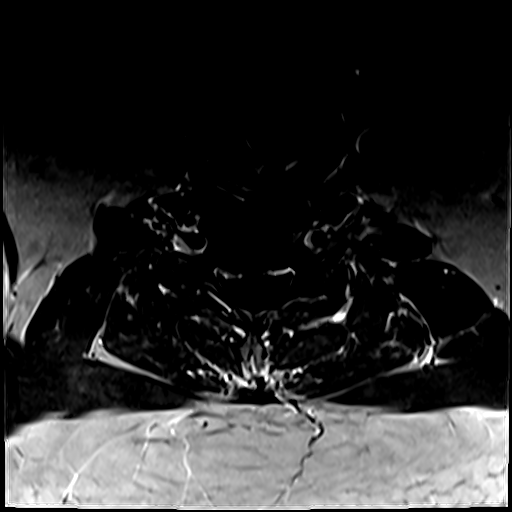
[im 20/38]
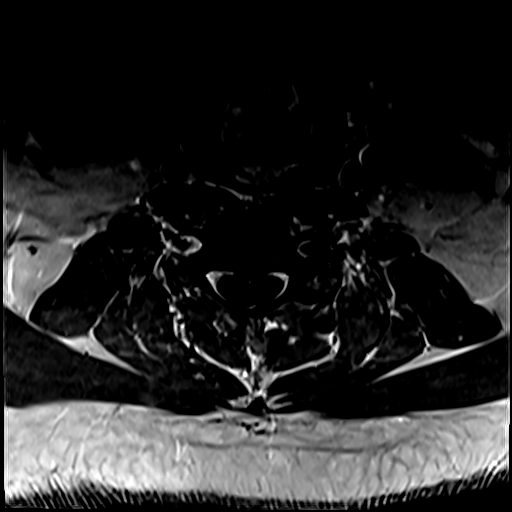
[im 22/38]
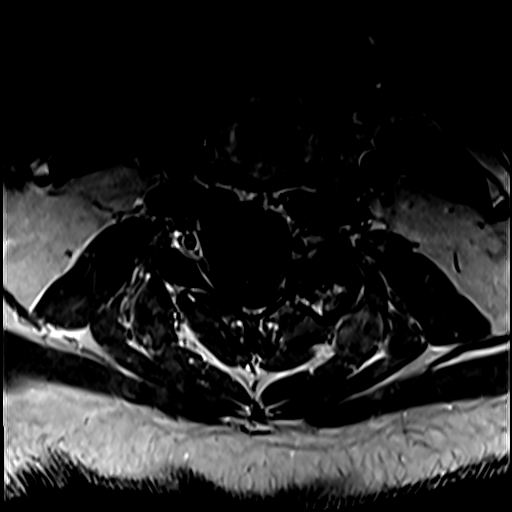
[im 26/38]
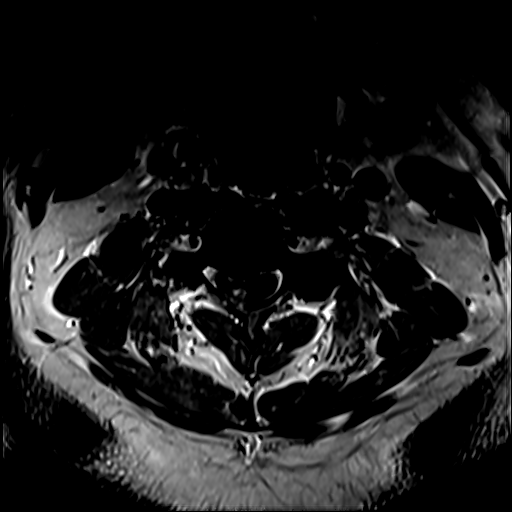
[im 32/38]
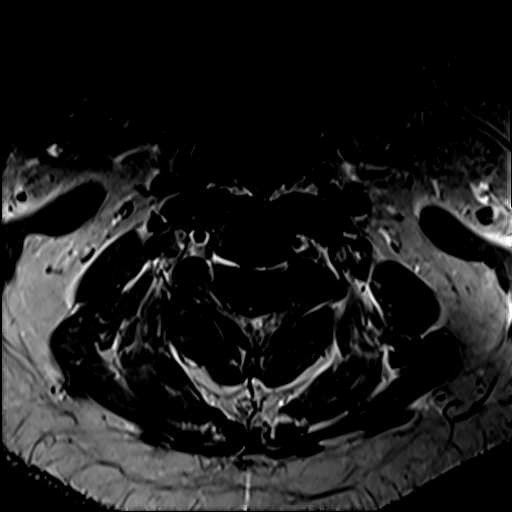
[im 36/38]
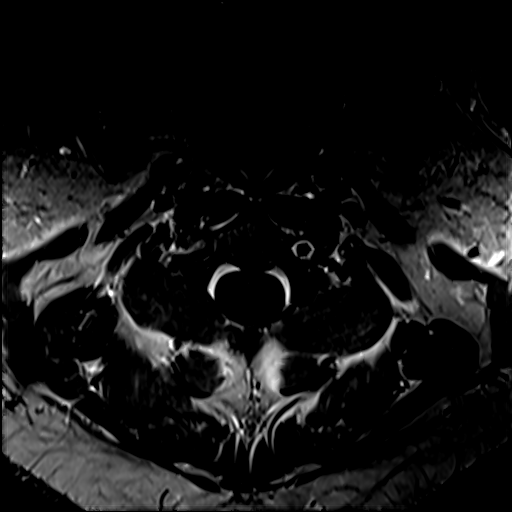

[21 of 48 positions shown; findings below may reference images not displayed]

FINDINGS: Alignment: Straightening of the cervical curvature. Small
anterolisthesis of C3 over C4, C4 over C5 and C7 over T1.

Vertebrae: Degenerative endplate changes are more pronounced at
C5-6. No suspicious osseous enhancement.

Cord: T2 hyperintense lesion described on prior MRI at the C4 level
shows prominent enhancement. No other focus of contrast enhancement
within the cord.

Posterior Fossa, vertebral arteries, paraspinal tissues: Negative.

Disc levels:

Multilevel degenerative changes of the cervical spine with
multilevel spinal canal stenosis, more pronounced at C5-6, and
multilevel neural foraminal narrowing. Findings have been described
in detail on recent MRI of the cervical spine performed on [DATE]. T2 hyperintense lesion at C4 shows prominent enhancement.
Findings are consistent with acute demyelinating disease.
2. No other focus of contrast enhancement within the cord.

## 2020-10-30 MED ORDER — GADOBENATE DIMEGLUMINE 529 MG/ML IV SOLN
20.0000 mL | Freq: Once | INTRAVENOUS | Status: AC | PRN
  Administered 2020-10-30: 20 mL via INTRAVENOUS

## 2020-10-31 ENCOUNTER — Telehealth: Payer: Self-pay

## 2020-10-31 DIAGNOSIS — G379 Demyelinating disease of central nervous system, unspecified: Secondary | ICD-10-CM

## 2020-10-31 NOTE — Telephone Encounter (Signed)
Order clarified, MRI Cervical Spine W/Contrast.

## 2020-10-31 NOTE — Progress Notes (Signed)
Please let pt know that Dr. Everlena Cooper is out of the office today.  I reviewed his MRI cervical spine and recommend we do MRI brain with and without contrast to see if we see similar changes in the brain.  If agreeable, please order for Dr. Everlena Cooper.  Dx:  demyelinating disease

## 2020-10-31 NOTE — Telephone Encounter (Signed)
-----   Message from Octaviano Batty Tat, DO sent at 10/31/2020  7:18 AM EST ----- Please let pt know that Dr. Everlena Cooper is out of the office today.  I reviewed his MRI cervical spine and recommend we do MRI brain with and without contrast to see if we see similar changes in the brain.  If agreeable, please order for Dr. Everlena Cooper.  Dx:  demyelinating disease

## 2020-10-31 NOTE — Telephone Encounter (Signed)
Pt advised of his MRI results and the recommendation to do a mri of the Brain w and without contrast.    Pt is in agreement.  MRI of the Brain W/WO COntrat ordered.

## 2020-11-05 ENCOUNTER — Telehealth: Payer: Self-pay | Admitting: Neurology

## 2020-11-05 DIAGNOSIS — G379 Demyelinating disease of central nervous system, unspecified: Secondary | ICD-10-CM

## 2020-11-05 DIAGNOSIS — R739 Hyperglycemia, unspecified: Secondary | ICD-10-CM

## 2020-11-05 NOTE — Addendum Note (Signed)
Addended by: Leida Lauth on: 11/05/2020 01:31 PM   Modules accepted: Orders

## 2020-11-05 NOTE — Telephone Encounter (Signed)
I called and spoke with Jeff Brewer.  I told him that we will move up his MRI of the brain with and without contrast as there is a concern for MS.  I would also like to set him up for Solu-Medrol 1000mg  IV daily for 3 days.  I advised to monitor his blood sugars.

## 2020-11-05 NOTE — Telephone Encounter (Signed)
Pt advised of Mri schedule at [facility] on 11/22 at 5 pm, Also pt needs to have Creatine levels checked before his MRI.    Home health for  Solu-Medrol 1000mg  IV daily for 3 days sent to [facility].

## 2020-11-12 ENCOUNTER — Ambulatory Visit (HOSPITAL_COMMUNITY)
Admission: RE | Admit: 2020-11-12 | Discharge: 2020-11-12 | Disposition: A | Discharge status: Home / Self Care | Payer: BC Managed Care – PPO | Source: Ambulatory Visit | Attending: Neurology | Admitting: Neurology

## 2020-11-12 DIAGNOSIS — G379 Demyelinating disease of central nervous system, unspecified: Secondary | ICD-10-CM | POA: Insufficient documentation

## 2020-11-12 IMAGING — MR MR HEAD WO/W CM
16 series · 48 of 48 positions shown · IV contrast (gadavist)
Comparison: Cervical spine studies earlier this month.

CLINICAL DATA: Right upper extremity numbness over the last 4
months. Cervical spinal cord lesion previously demonstrated. Likely
demyelinating disease.

EXAM:
MRI HEAD WITHOUT AND WITH CONTRAST
TECHNIQUE: Multiplanar, multiecho pulse sequences of the brain and surrounding
structures were obtained without and with intravenous contrast.
CONTRAST:  10mL GADAVIST GADOBUTROL 1 MMOL/ML IV SOLN

[Series 13: DWI · axial · 3.0mm · 1.36mm/px · z∈[-64,+101]mm · 6 of 112 slices shown (1 of 4)]
[im 1/112]
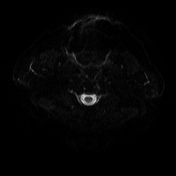
[im 23/112]
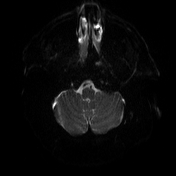
[im 45/112]
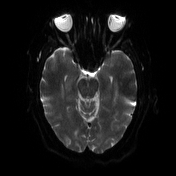
[im 67/112]
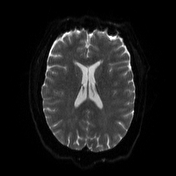
[im 89/112]
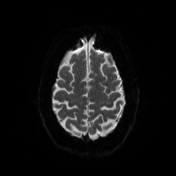
[im 112/112]
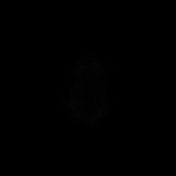

[Series 14: DWI · axial · 3.0mm · 1.36mm/px · z∈[-64,+101]mm · 2 of 56 slices shown (2 of 4)]
[im 1/56]
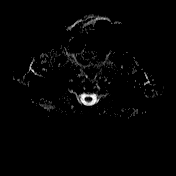
[im 56/56]
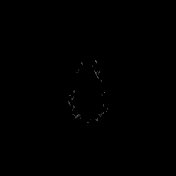

[Series 15: mip_images(sw) · axial · 24.0mm · 0.75mm/px · z∈[-54,+90]mm · 2 of 49 slices shown]
[im 1/49]
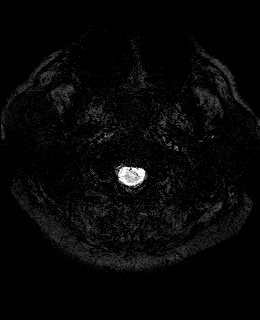
[im 49/49]
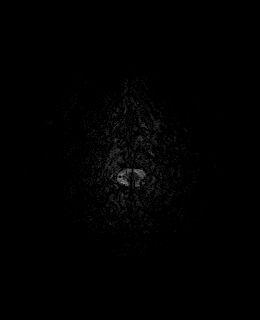

[Series 16: swi_images · axial · 3.0mm · 0.75mm/px · z∈[-64,+101]mm · 2 of 56 slices shown]
[im 1/56]
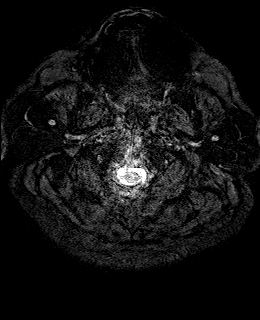
[im 56/56]
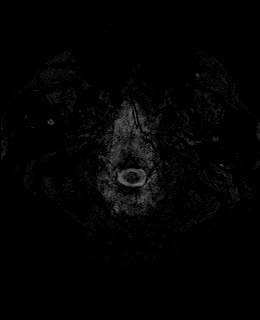

[Series 17: FLAIR · axial · 3.0mm · 0.75mm/px · z∈[-60,+102]mm · 2 of 55 slices shown (1 of 2)]
[im 1/55]
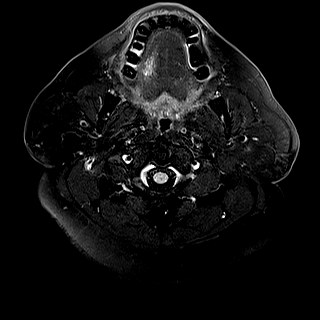
[im 55/55]
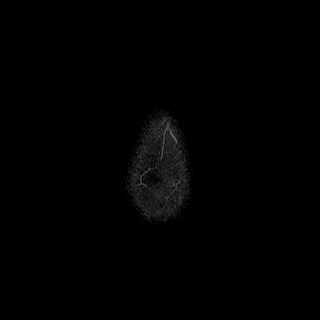

[Series 18: T1 · sagittal · 5.0mm · 0.75mm/px · 1 of 24 slices shown (1 of 2)]
[im 1/24]
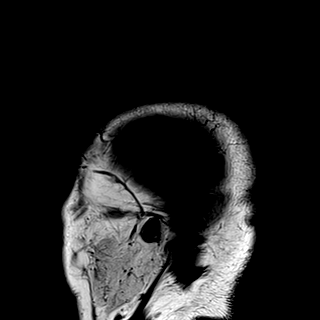

[Series 19: FLAIR · sagittal · 1.1mm · 0.51mm/px · 6 of 144 slices shown (2 of 2)]
[im 1/144]
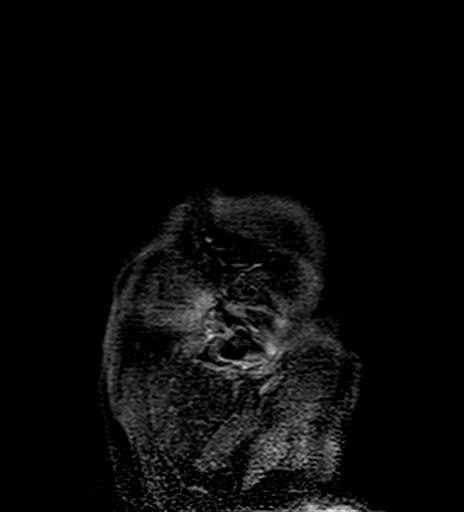
[im 29/144]
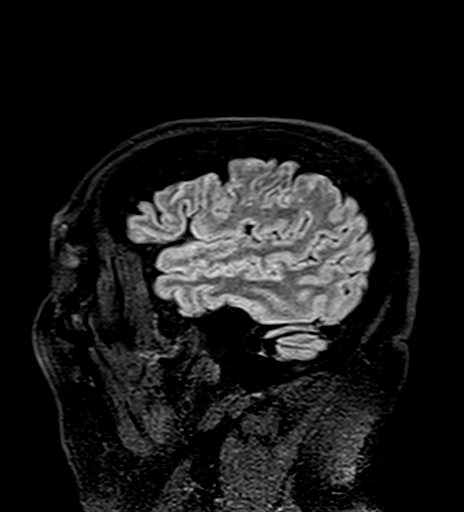
[im 58/144]
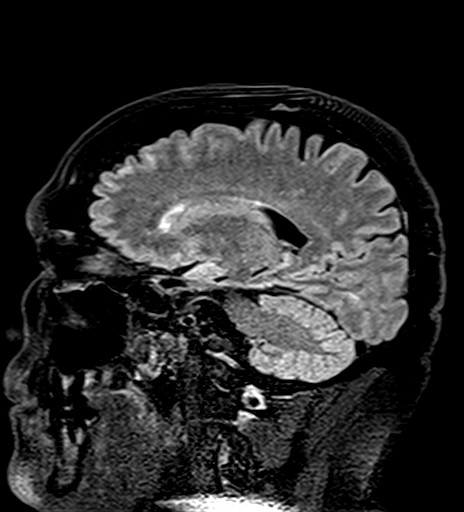
[im 86/144]
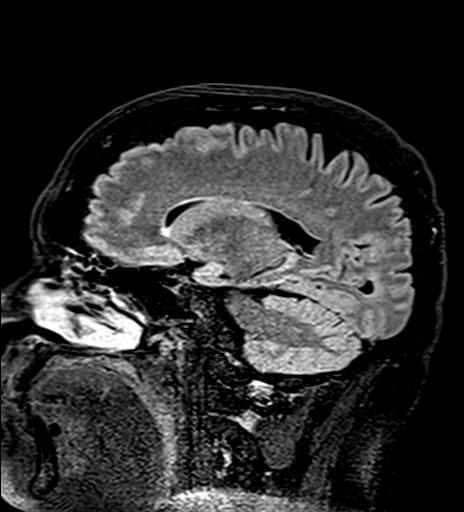
[im 115/144]
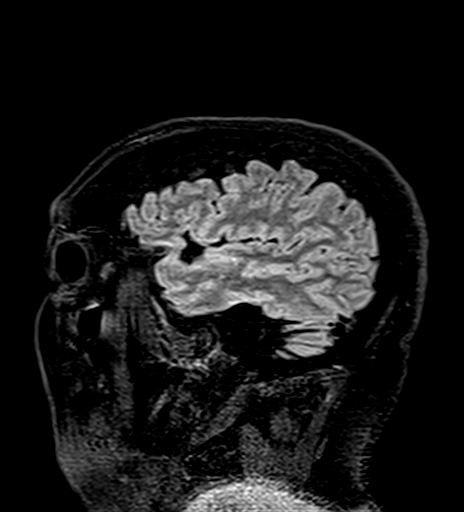
[im 144/144]
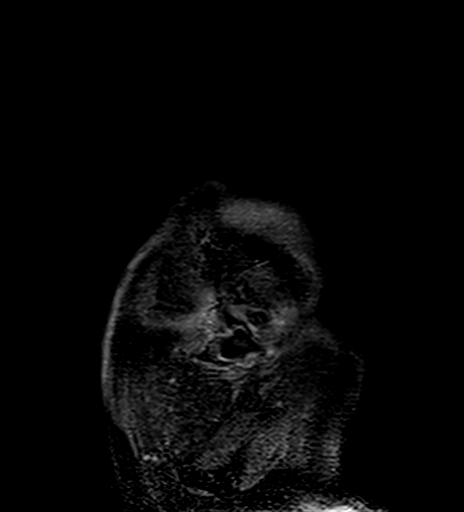

[Series 20: T2 · sagittal · 1.5mm · 1.46mm/px · 4 of 104 slices shown (1 of 2)]
[im 1/104]
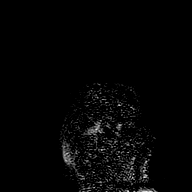
[im 35/104]
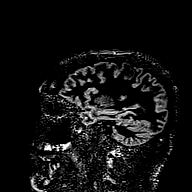
[im 69/104]
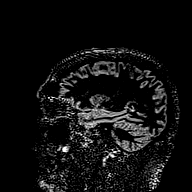
[im 104/104]
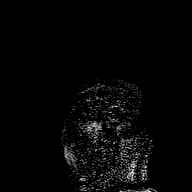

[Series 21: T2 · axial · 5.0mm · 0.62mm/px · 1 of 25 slices shown (2 of 2)]
[im 1/25]
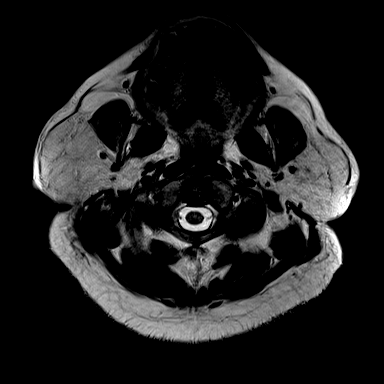

[Series 22: T1 · axial · 1.0mm · 0.94mm/px · z∈[-59,+115]mm · 7 of 176 slices shown (2 of 2)]
[im 1/176]
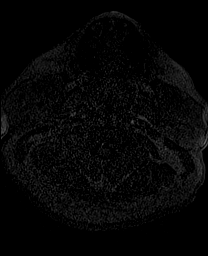
[im 30/176]
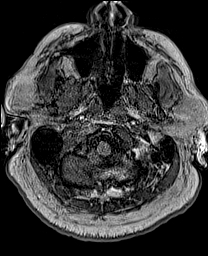
[im 59/176]
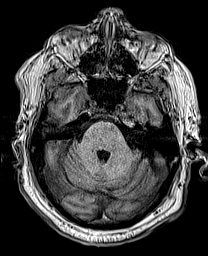
[im 88/176]
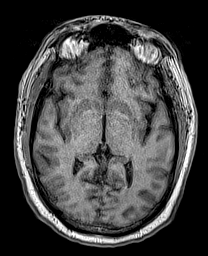
[im 117/176]
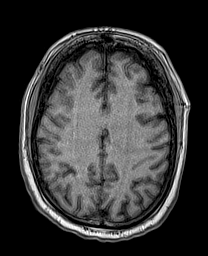
[im 146/176]
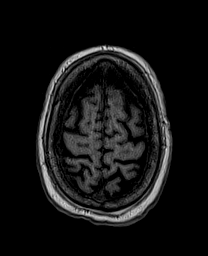
[im 176/176]
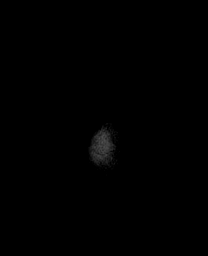

[Series 23: DWI · coronal · 5.0mm · 1.31mm/px · 3 of 80 slices shown (3 of 4)]
[im 1/80]
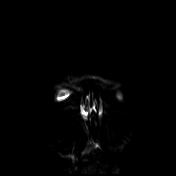
[im 40/80]
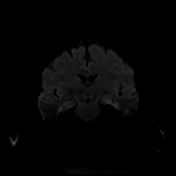
[im 80/80]
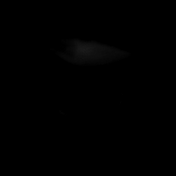

[Series 24: DWI · coronal · 5.0mm · 1.31mm/px · 2 of 40 slices shown (4 of 4)]
[im 1/40]
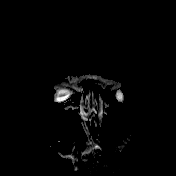
[im 40/40]
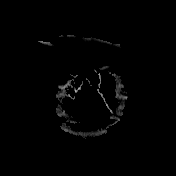

[Series 29: T2 post-contrast · coronal · 5.0mm · 0.69mm/px · 1 of 30 slices shown]
[im 1/30]
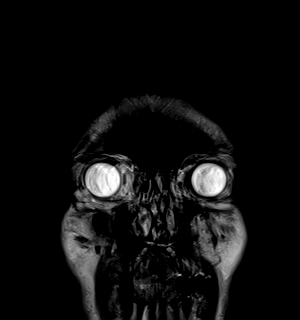

[Series 30: T1 post-contrast · axial · 1.0mm · 0.94mm/px · z∈[-54,+120]mm · 7 of 176 slices shown (1 of 3)]
[im 1/176]
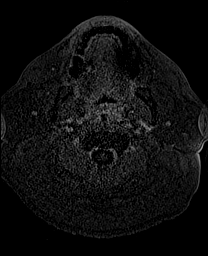
[im 30/176]
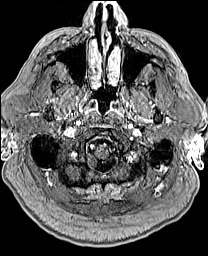
[im 59/176]
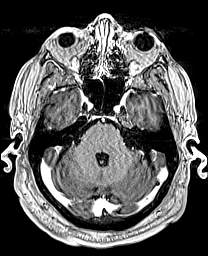
[im 88/176]
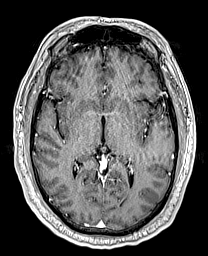
[im 117/176]
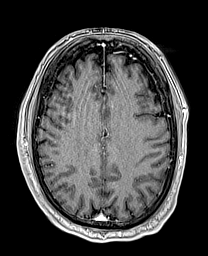
[im 146/176]
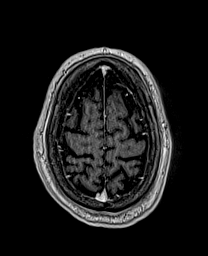
[im 176/176]
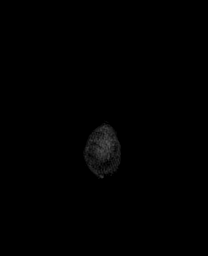

[Series 31: T1 post-contrast · coronal · 5.0mm · 0.43mm/px · 1 of 30 slices shown (2 of 3)]
[im 1/30]
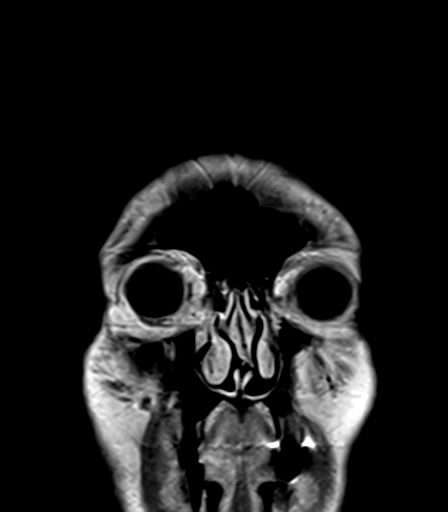

[Series 32: T1 post-contrast · sagittal · 5.0mm · 0.75mm/px · 1 of 24 slices shown (3 of 3)]
[im 1/24]
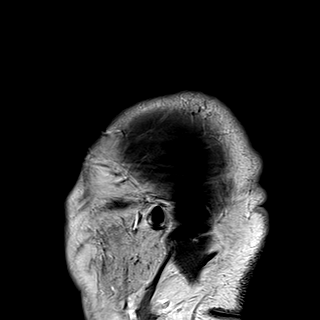

[48 of 48 positions shown; findings below may reference images not displayed]

FINDINGS: Brain: Brain does not show age advanced atrophy. Diffusion imaging
does not show any acute or subacute infarction or other cause of
restricted diffusion. No abnormality affects the brainstem or
cerebellum. Cerebral hemispheres show a few punctate foci of T2 and
FLAIR signal within the white matter, primarily subcortical, often
seen at this age in usually either not significant or reflecting the
earliest manifestation of small vessel change. There are no white
matter lesions specifically suggestive of demyelinating disease. No
mass, hemorrhage, hydrocephalus or extra-axial collection. After
contrast administration, no abnormal enhancement occurs.

Vascular: Major vessels at the base of the brain show flow.

Skull and upper cervical spine: Negative

Sinuses/Orbits: Clear/normal

Other: None
IMPRESSION: Essentially normal examination for age. Few punctate foci of T2 and
FLAIR signal within the hemispheric white matter, primarily
subcortical, often seen at this age or reflecting the earliest
manifestation of small vessel change. No white matter lesions
specifically suggestive of demyelinating disease.

To my review of the cervical spine imaging, I am more concerned
about compressive myelopathy at C3-4 and C5-6 than I am about
demyelinating disease. Consideration of cervical decompression
recommended, as I am sure is being evaluated.

## 2020-11-12 MED ORDER — GADOBUTROL 1 MMOL/ML IV SOLN
10.0000 mL | Freq: Once | INTRAVENOUS | Status: AC | PRN
  Administered 2020-11-12: 10 mL via INTRAVENOUS

## 2020-11-13 ENCOUNTER — Telehealth: Payer: Self-pay

## 2020-11-13 DIAGNOSIS — G379 Demyelinating disease of central nervous system, unspecified: Secondary | ICD-10-CM

## 2020-11-13 NOTE — Telephone Encounter (Signed)
-----   Message from Drema Dallas, DO sent at 11/13/2020  8:28 AM EST ----- MRI of brain is normal.  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  I would like him seen by neurosurgery as soon as possible.

## 2020-11-13 NOTE — Telephone Encounter (Signed)
Pt advised of Mri results and recommendation for Neurosurgery

## 2020-11-18 ENCOUNTER — Other Ambulatory Visit: Payer: BC Managed Care – PPO

## 2020-11-21 ENCOUNTER — Other Ambulatory Visit: Payer: BC Managed Care – PPO

## 2020-11-29 ENCOUNTER — Other Ambulatory Visit: Payer: Self-pay | Admitting: Cardiology

## 2020-12-06 ENCOUNTER — Encounter: Payer: Self-pay | Admitting: Cardiology

## 2020-12-06 ENCOUNTER — Other Ambulatory Visit: Payer: Self-pay

## 2020-12-06 ENCOUNTER — Ambulatory Visit: Payer: BC Managed Care – PPO | Admitting: Cardiology

## 2020-12-06 VITALS — BP 137/78 | HR 71 | Resp 16 | Ht 60.0 in | Wt 244.0 lb

## 2020-12-06 DIAGNOSIS — E7800 Pure hypercholesterolemia, unspecified: Secondary | ICD-10-CM

## 2020-12-06 DIAGNOSIS — Z01810 Encounter for preprocedural cardiovascular examination: Secondary | ICD-10-CM

## 2020-12-06 DIAGNOSIS — R0789 Other chest pain: Secondary | ICD-10-CM

## 2020-12-06 DIAGNOSIS — R739 Hyperglycemia, unspecified: Secondary | ICD-10-CM

## 2020-12-06 DIAGNOSIS — I10 Essential (primary) hypertension: Secondary | ICD-10-CM

## 2020-12-06 NOTE — Progress Notes (Signed)
Primary Physician/Referring:  Bernerd Limbo, MD  Patient ID: Jeff Brewer, male    DOB: Aug 08, 1961, 59 y.o.   MRN: [id number]  Chief Complaint  Patient presents with  . Hypertension  . Hyperlipidemia  . Follow-up    6 month   HPI: Jeff Brewer  is a 59 y.o. male  with  hypertension, hyperlipidemia, hyperglycemia, prediabetes, mild obesity, family history of premature is CAD, gout, chronic corpulmonale and OSA on CPAP, Atypical chest pain with positive nuclear stress test in February 2016 revealing abnormal EKG response to treadmill stress test along with inferior apical ischemia, was started on aggressive medical therapy including weight loss.    Recently has had left knee replacement with excellent recovery, however had developed severe pain and tingling and numbness in his right hand and he is now being diagnosed with cervical spine stenosis and needs surgery.  This is 71-monthoffice visit.  States that he has been extremely anxious since the diagnosis of neuropathy and every time he gets anxious he feels chest discomfort in the middle of the chest.  Lasts about 2 to 3 minutes and spontaneously relieved with just relaxing.  No exertional component.  No dyspnea.  Past Medical History:  Diagnosis Date  . Atypical chest pain   . Diabetes mellitus without complication ([place]   . Essential hypertension 02/07/2019  . Fatigue 02/08/2019  . Gout   . Hypercholesteremia   . Hyperglycemia 02/07/2019  . Hyperlipidemia 02/07/2019  . Hypertension   . Obesity    moderate  . Obstructive sleep apnea 02/08/2019   Resolved with weight loss  . OSA (obstructive sleep apnea)     Past Surgical History:  Procedure Laterality Date  . APPENDECTOMY     age 59 . ARTHROSCOPIC REPAIR ACL Left    1989  . ARTHROSCOPIC REPAIR ACL Right 1994   Social History   Tobacco Use  . Smoking status: Former Smoker    Types: Cigarettes  . 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 Tobacco comment: quit 20 years ago   Substance Use Topics  . Alcohol use: Yes    Alcohol/week: 0.0 standard drinks    Comment: 4-5 drinks weekly   Marital Status: Married   Review of Systems  Cardiovascular: Positive for chest pain. Negative for dyspnea on exertion and leg swelling.  Musculoskeletal: Positive for arthritis (left knee, neck), neck pain and stiffness (right hand).  Gastrointestinal: Negative for melena.    Objective  Blood pressure 137/78, pulse 71, resp. rate 16, height 5' (1.524 m), weight 244 lb (110.7 kg), SpO2 96 %. Body mass index is 47.65 kg/m.   Vitals with BMI 12/06/2020 12/06/2020 10/22/2020  Height - _0  _1   Weight - 244 lbs 248 lbs  BMI - 418.29493.71 Systolic [id number] Diastolic 78 83 80  Pulse 71 71 74     Physical Exam Constitutional:      General: He is not in acute distress.    Appearance: He is well-developed.     Comments: Morbidly obese  Neck:     Thyroid: No thyromegaly.     Vascular: No JVD.  Cardiovascular:     Rate and Rhythm: Normal rate and regular rhythm.     Pulses: Intact distal pulses.     Heart sounds: Normal heart sounds. No murmur heard. No gallop.   Pulmonary:     Effort: Pulmonary effort is normal.     Breath sounds: Normal breath sounds.  Abdominal:  General: Bowel sounds are normal.     Palpations: Abdomen is soft.  Musculoskeletal:     Cervical back: Neck supple.    Radiology: No results found.  Laboratory examination:   CMP Latest Ref Rng & Units 06/08/2020  Glucose 65 - 99 mg/dL 84  BUN 6 - 24 mg/dL 19  Creatinine 0.76 - 1.27 mg/dL 1.30(H)  Sodium 134 - 144 mmol/L 138  Potassium 3.5 - 5.2 mmol/L 4.4  Chloride 96 - 106 mmol/L 100  CO2 20 - 29 mmol/L 21  Calcium 8.7 - 10.2 mg/dL 9.9  Total Protein 6.0 - 8.5 g/dL 7.4  Total Bilirubin 0.0 - 1.2 mg/dL 0.4  Alkaline Phos 48 - 121 IU/L 90  AST 0 - 40 IU/L 27  ALT 0 - 44 IU/L 34   No flowsheet data found. Lipid Panel     Component Value Date/Time   CHOL 126 06/08/2020 0901    TRIG 124 06/08/2020 0901   HDL 50 06/08/2020 0901   LDLCALC 54 06/08/2020 0901   HEMOGLOBIN A1C Lab Results  Component Value Date   HGBA1C 5.8 (H) 06/08/2020   TSH Recent Labs    06/08/20 0901  TSH 2.870     Labs 01/11/2019: Serum glucose 87 mg, BUN 16, creatinine 1.33, eGFR 68 mL.  08/06/2018: TSH 2.79.  07/29/2018: Hemoglobin A1c 5.7%.  Uric acid 6.9.  03/31/2018: Creatinine 1.27, EGFR 62/72, potassium 4.3, CMP normal.  Cholesterol 136, triglycerides 161, HDL 51, LDL 53.  Hemoglobin A1c 6.4%.  06/23/2017: Cholesterol 126, triglycerides 146, HDL 40, LDL 57.  Creatinine 1.2, potassium 4.3, CMP normal.  Uric acid normal at 7.5.  CMP Latest Ref Rng & Units 06/08/2020  Glucose 65 - 99 mg/dL 84  BUN 6 - 24 mg/dL 19  Creatinine 0.76 - 1.27 mg/dL 1.30(H)  Sodium 134 - 144 mmol/L 138  Potassium 3.5 - 5.2 mmol/L 4.4  Chloride 96 - 106 mmol/L 100  CO2 20 - 29 mmol/L 21  Calcium 8.7 - 10.2 mg/dL 9.9  Total Protein 6.0 - 8.5 g/dL 7.4  Total Bilirubin 0.0 - 1.2 mg/dL 0.4  Alkaline Phos 48 - 121 IU/L 90  AST 0 - 40 IU/L 27  ALT 0 - 44 IU/L 34   No flowsheet data found. Lipid Panel     Component Value Date/Time   CHOL 126 06/08/2020 0901   TRIG 124 06/08/2020 0901   HDL 50 06/08/2020 0901   LDLCALC 54 06/08/2020 0901   HEMOGLOBIN A1C Lab Results  Component Value Date   HGBA1C 5.8 (H) 06/08/2020   TSH Recent Labs    06/08/20 0901  TSH 2.870    Medications   Current Outpatient Medications  Medication Instructions  . allopurinol (ZYLOPRIM) 100 mg, Oral, Daily  . aspirin EC 81 mg, Oral, Daily  . buPROPion (WELLBUTRIN SR) 150 mg, Oral, 2 times daily, AM and 3 pm.  . colchicine 0.6 mg, Oral, Daily  . eplerenone (INSPRA) 50 MG tablet No dose, route, or frequency recorded.  . fexofenadine (ALLEGRA) 180 mg, Oral, Daily, As needed   . fluticasone (FLOVENT DISKUS) 50 MCG/BLIST diskus inhaler 1 puff, Inhalation, Daily, One spray each nostril daily as needed   . gabapentin  (NEURONTIN) 300 MG capsule Oral  . hydrALAZINE (APRESOLINE) 25 mg, Oral, 3 times daily PRN, For SBP >140/80 mm Hg  . meloxicam (MOBIC) 15 mg, Oral, Daily  . metoprolol succinate (TOPROL-XL) 50 mg, Oral, Daily PRN, Take with or immediately following a meal.  . mometasone (NASONEX)  50 MCG/ACT nasal spray 2 sprays, Nasal, Daily  . NON FORMULARY Honey ginger with lime   . probenecid (BENEMID) 500 mg, Oral, Daily  . rosuvastatin (CRESTOR) 20 mg, Oral, Daily  . sildenafil (VIAGRA) 100 mg, Oral, Daily PRN  . valsartan-hydrochlorothiazide (DIOVAN-HCT) 320-12.5 MG tablet TAKE ONE TABLET BY MOUTH EVERY MORNING  . verapamil (VERELAN PM) 180 MG 24 hr capsule TAKE 1 CAPSULE BY MOUTH EVERY EVENING AFTER DINNER  . Vitamin D (Ergocalciferol) (DRISDOL) 50,000 Units, Oral, Every 7 days   Cardiac Studies:   CARDIAC STUDIES:  Exercise sestamibi stress test 01/26/2015: 1. The resting electrocardiogram demonstrated normal sinus rhythm, normal resting conduction, no resting arrhythmias and normal rest repolarization. Low voltage.   The stress electrocardiogram revealed 1 mm of upsloping ST depression in lead (s):II, III, aVF, V5, V6 which was back to base at < 2 minutes into recovery. The patient performed treadmill exercise using a Bruce protocol, completing 7 minutes, an estimated workload of 7.3 METS. The heart rate was 90% MPHR. The stress test was terminated because of fatigue and dyspnea. 2. There is suggestion of very mild mid inferior to inferoapical ischemia. Gated SPECT images reveal normal myocardial thickening and wall motion.  The left ventricular ejection fraction was calculated or visually estimated to be 57%, normal.  Overall this represents a low risk study, clinical correlation recommended the patient weighs 270 pounds.  Echo- 01/30/15 1. Left ventricle cavity is normal in size. Mild to moderate concentric hypertrophy of the left ventricle. Normal global wall motion. Calculated EF 55%. 2. Right atrial  cavity is slightly dilated. 3. Right ventricle cavity is slightly dilated. Normal right ventricular function. 4. Trace mitral regurgitation. 5. Trace tricuspid regurgitation. No evidence of pulmonary hypertension.  EKG:   EKG 12/06/2020: Normal sinus rhythm at the rate of 69 bpm, normal axis, no evidence of ischemia.  Normal EKG.     Assessment     ICD-10-CM   1. Atypical chest pain  R07.89   2. Pre-operative cardiovascular examination: Cervical spine surgery 12/17/2020  Z01.810   3. Essential hypertension  I10 EKG 12-Lead  4. Hyperglycemia  R73.9   5. Pure hypercholesterolemia  E78.00     Recommendations:   Jeff Brewer  is a 59 y.o.  male  with  hypertension, hyperlipidemia, hyperglycemia, prediabetes, mild obesity, family history of premature is CAD, gout, chronic corpulmonale and OSA on CPAP, Atypical chest pain with positive nuclear stress test in February 2016 revealing abnormal EKG response to treadmill stress test along with inferior apical ischemia, was started on aggressive medical therapy including weight loss.    Recently has had left knee replacement with excellent recovery, however had developed severe pain and tingling and numbness in his right hand and he is now being diagnosed with cervical spine stenosis and needs surgery.  Since then he has been extremely anxious and every time he is anxious he gets chest discomfort which I think is atypical do not suspect angina pectoris.  Lasts only a few minutes.  Also he has been walking around the neighborhood without any chest pain.  There is no clinical evidence heart failure, blood pressure is well controlled, all his risk factors are well controlled including lipids.  He does have mild hyperglycemia but not a diabetic.  He is overall low risk for surgery and will send a note to Dr. Dierdre Harness.  He does not have a PCP, I will forward my note to Dr. Bernerd Limbo to establish himself for primary care needs.  Adrian Prows, MD,  [facility] 12/06/2020, 3:08 PM Office: [phone] Pager: [phone]

## 2020-12-06 NOTE — Patient Instructions (Signed)
You will see Dr. Tracey Harries for your PCP

## 2020-12-19 ENCOUNTER — Ambulatory Visit: Payer: BC Managed Care – PPO | Admitting: Neurology

## 2021-01-14 NOTE — Progress Notes (Signed)
NEUROLOGY FOLLOW UP OFFICE NOTE  Jeff Brewer [id number]   Subjective:  Jeff Brewer is a 60 year old right-handed male with HTN, DM2, HLD who follows up for right arm numbness and weakness.  MRI of brain and cervical spine personally reviewed.  UPDATE: MRI of cervical spine without contrast on 10/25/2020 showed multilevel spondylosis with spinal stenosis from C3-5 through C6-7, most severe at C5-6 with myelomalacia at C4-6 and focal cord hyperintensity at C4 level.  Concern that this represented a demyelinating lesion.  Follow up post-contrast MRI of cervical spine on 10/30/2020 showed enhancement of this lesion, suspicious for acute demyelination.  He had an MRI of the brain with and without contrast on 11/13/2020 which was unremarkable.  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  He underwent ACDF 3 weeks ago.  He has already noticed improvement.  Numbness in left hand completely resolved.  Numbness in right hand resolved except for tingling in the first 3 fingers.  He is able to write, drive and dress again.  Some neck tightness.  HISTORY: In August 2021, he underwent left knee replacement.  Immediately afterwards, he noticed numbness in the finger tips of the first 3 digits of both hands.  About 4 weeks ago, he developed complete numbness of the first 3 digits of the right hand, radiating up the lateral arm to the shoulder.  He also reports pain in the thenar aspect of his left hand.  No weakness but due to the numbness, he cannot use his hand such as typing and writing.  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  He had a NCV-EMG performed on 10/01/2020 which was normal.  He underwent a carpal tunnel steroid injection which was ineffective.  [name]  PAST MEDICAL HISTORY: Past Medical History:  Diagnosis Date  . Atypical chest pain   . Diabetes  mellitus without complication ([facility])   . Essential hypertension 02/07/2019  . Fatigue 02/08/2019  . Gout   . Hypercholesteremia   . Hyperglycemia 02/07/2019  . Hyperlipidemia 02/07/2019  . Hypertension   . Obesity    moderate  . Obstructive sleep apnea 02/08/2019   Resolved with weight loss  . OSA (obstructive sleep apnea)     MEDICATIONS: Current Outpatient Medications on File Prior to Visit  Medication Sig Dispense Refill  . allopurinol (ZYLOPRIM) 100 MG tablet Take 1 tablet (100 mg total) by mouth daily. 90 tablet 0  . aspirin EC 81 MG tablet Take 81 mg by mouth daily.    [name] buPROPion (WELLBUTRIN SR) 150 MG 12 hr tablet Take 1 tablet (150 mg total) by mouth in the morning and at bedtime. AM and 3 pm. 60 tablet 6  . colchicine 0.6 MG tablet Take 0.6 mg by mouth daily.    [name] eplerenone (INSPRA) 50 MG tablet   0  . fexofenadine (ALLEGRA) 180 MG tablet Take 180 mg by mouth daily. As needed    . fluticasone (FLOVENT DISKUS) 50 MCG/BLIST diskus inhaler Inhale 1 puff into the lungs daily. One spray each nostril daily as needed    . gabapentin (NEURONTIN) 300 MG capsule Take by mouth.    . hydrALAZINE (APRESOLINE) 25 MG tablet Take 1 tablet (25 mg total) by mouth 3 (three) times daily as needed. For SBP >140/80 mm Hg 180 tablet 0  . meloxicam (MOBIC) 15 MG tablet Take 15 mg by mouth  daily.    . metoprolol succinate (TOPROL-XL) 50 MG 24 hr tablet Take 50 mg by mouth daily as needed (For SBP >130 mm Hg). Take with or immediately following a meal. 90 tablet 3  . mometasone (NASONEX) 50 MCG/ACT nasal spray Place 2 sprays into the nose daily. 17 g 12  . NON FORMULARY Honey ginger with lime    . probenecid (BENEMID) 500 MG tablet Take 1 tablet (500 mg total) by mouth daily. 90 tablet 0  . rosuvastatin (CRESTOR) 20 MG tablet Take 1 tablet (20 mg total) by mouth daily. 90 tablet 0  . sildenafil (VIAGRA) 100 MG tablet Take 100 mg by mouth daily as needed for erectile dysfunction.    .  valsartan-hydrochlorothiazide (DIOVAN-HCT) 320-12.5 MG tablet TAKE ONE TABLET BY MOUTH EVERY MORNING 90 tablet 3  . verapamil (VERELAN PM) 180 MG 24 hr capsule TAKE 1 CAPSULE BY MOUTH EVERY EVENING AFTER DINNER 90 capsule 2  . Vitamin D, Ergocalciferol, (DRISDOL) 50000 UNITS CAPS capsule Take 50,000 Units by mouth every 7 (seven) days.     No current facility-administered medications on file prior to visit.    ALLERGIES: Allergies  Allergen Reactions  . Aspirin Rash  . Sulfa Antibiotics Rash    FAMILY HISTORY: Family History  Problem Relation Age of Onset  . Heart Problems Mother   . Heart Problems Father        known hole in heart    SOCIAL HISTORY: Social History   Socioeconomic History  . Marital status: Married    Spouse name: Loma Newton  . Number of children: 1  . Years of education: Masters  . Highest education level: Not on file  Occupational History    Comment: [employer]  Tobacco Use  . Smoking status: Former Smoker    Types: Cigarettes  . Smokeless tobacco: Former Neurosurgeon  . Tobacco comment: quit 20 years ago  Vaping Use  . Vaping Use: Never used  Substance and Sexual Activity  . Alcohol use: Yes    Alcohol/week: 0.0 standard drinks    Comment: 4-5 drinks weekly  . Drug use: No  . Sexual activity: Not on file  Other Topics Concern  . Not on file  Social History Narrative   Patient consumes 1 cup of coffee am and pm   Right handed   Social Determinants of Health   Financial Resource Strain: Not on file  Food Insecurity: Not on file  Transportation Needs: Not on file  Physical Activity: Not on file  Stress: Not on file  Social Connections: Not on file  Intimate Partner Violence: Not on file     Objective:  Blood pressure (!) 143/87, pulse 83, height 5\' 10"  (1.778 m), weight 246 lb 12.8 oz (111.9 kg), SpO2 95 %. General: No acute distress.  Patient appears well-groomed.   Head:  Normocephalic/atraumatic Eyes:  Fundi examined but not  visualized Neck: supple, no paraspinal tenderness, full range of motion Heart:  Regular rate and rhythm Lungs:  Clear to auscultation bilaterally Back: No paraspinal tenderness Neurological Exam: alert and oriented to person, place, and time. Attention span and concentration intact, recent and remote memory intact, fund of knowledge intact.  Speech fluent and not dysarthric, language intact.  CN II-XII intact. Bulk and tone normal, muscle strength 5/5 throughout.  Sensation to light touch intact.  Deep tendon reflexes 2+ throughout.  Finger to nose and heel to shin testing intact.  Gait normal, Romberg negative.   Assessment/Plan:   Cervical myelopathy s/p  cervical decompression - symptoms improved  He has follow up with neurosurgery in 6 weeks.  Follow up with me in 6 months.  Shon Millet, DO  CC: Tracey Harries, MD

## 2021-01-15 ENCOUNTER — Encounter: Payer: Self-pay | Admitting: Neurology

## 2021-01-15 ENCOUNTER — Other Ambulatory Visit: Payer: Self-pay

## 2021-01-15 ENCOUNTER — Ambulatory Visit (INDEPENDENT_AMBULATORY_CARE_PROVIDER_SITE_OTHER): Payer: BC Managed Care – PPO | Admitting: Neurology

## 2021-01-15 VITALS — BP 143/87 | HR 83 | Ht 70.0 in | Wt 246.8 lb

## 2021-01-15 DIAGNOSIS — G959 Disease of spinal cord, unspecified: Secondary | ICD-10-CM

## 2021-01-15 NOTE — Patient Instructions (Signed)
Continue time to heal

## 2021-01-16 ENCOUNTER — Telehealth: Payer: Self-pay | Admitting: Neurology

## 2021-01-16 NOTE — Telephone Encounter (Signed)
Patient called in stating he forgot to ask Dr. Everlena Cooper a question yesterday at his appointment. He would like to know if he can get his flu shot?

## 2021-01-16 NOTE — Telephone Encounter (Signed)
Pt advised.

## 2021-01-16 NOTE — Telephone Encounter (Signed)
Yes, he may get the flu shot

## 2021-02-05 ENCOUNTER — Other Ambulatory Visit: Payer: Self-pay | Admitting: Cardiology

## 2021-02-07 ENCOUNTER — Other Ambulatory Visit: Payer: Self-pay | Admitting: Cardiology

## 2021-04-18 ENCOUNTER — Other Ambulatory Visit: Payer: Self-pay | Admitting: Cardiology

## 2021-04-18 DIAGNOSIS — J3089 Other allergic rhinitis: Secondary | ICD-10-CM

## 2021-04-30 ENCOUNTER — Telehealth: Payer: Self-pay

## 2021-04-30 NOTE — Telephone Encounter (Signed)
[employer] denied patients Mometasone Furoate nasal spray

## 2021-06-13 ENCOUNTER — Ambulatory Visit: Payer: BC Managed Care – PPO | Admitting: Cardiology

## 2021-06-13 ENCOUNTER — Encounter: Payer: Self-pay | Admitting: Cardiology

## 2021-06-13 ENCOUNTER — Other Ambulatory Visit: Payer: Self-pay

## 2021-06-13 VITALS — BP 114/83 | HR 85 | Temp 98.5°F | Resp 16 | Ht 70.0 in | Wt 254.8 lb

## 2021-06-13 DIAGNOSIS — I10 Essential (primary) hypertension: Secondary | ICD-10-CM

## 2021-06-13 DIAGNOSIS — R739 Hyperglycemia, unspecified: Secondary | ICD-10-CM

## 2021-06-13 DIAGNOSIS — R0789 Other chest pain: Secondary | ICD-10-CM

## 2021-06-13 MED ORDER — PHENTERMINE HCL 30 MG PO CAPS: 30.0000 mg | ORAL_CAPSULE | ORAL | 2 refills | Status: DC

## 2021-06-13 NOTE — Progress Notes (Signed)
Primary Physician/Referring:  Bernerd Limbo, MD  Patient ID: Jeff Brewer, male    DOB: 1961-10-24, 60 y.o.   MRN: [id number]  Chief Complaint  Patient presents with   Chest Pain   Hyperlipidemia   Hypertension   Follow-up    6 months   HPI: Jeff Brewer  is a 60 y.o. male with  hypertension, hyperlipidemia, hyperglycemia, prediabetes, mild obesity, family history of premature is CAD, gout, chronic corpulmonale and OSA on CPAP, atypical chest pain with positive nuclear stress test in February 2016 revealing abnormal EKG response to treadmill stress test along with inferior apical ischemia.  This is 81-monthoffice visit.  Since cervical spine surgery on 04/19/2020, he has felt well, and has only mild resto tingling and numbness in his right thumb.  He complains of sharp chest pain in the left upper part of the chest that last a few seconds but has been active and has been able to do all activities of daily living without any chest pain or dyspnea.  He is concerned about his ongoing obesity.  Past Medical History:  Diagnosis Date   Atypical chest pain    Diabetes mellitus without complication ([facility]    Essential hypertension 02/07/2019   Fatigue 02/08/2019   Gout    Hypercholesteremia    Hyperglycemia 02/07/2019   Hyperlipidemia 02/07/2019   Hypertension    Obesity    moderate   Obstructive sleep apnea 02/08/2019   Resolved with weight loss   OSA (obstructive sleep apnea)     Past Surgical History:  Procedure Laterality Date   APPENDECTOMY     age 60  ARTHRODESIS     Anterior Cervical arthrodesis and insertion of interbody Bio Cev with anterior fusion 12/19/2020   ARTHROSCOPIC REPAIR ACL Left    1989   ARTHROSCOPIC REPAIR ACL Right 12/22/1992   Social History   Tobacco Use   Smoking status: Former    Pack years: 0.00    Types: Cigarettes   Smokeless tobacco: Former   Tobacco comments:    quit 20 years ago  Substance Use Topics   Alcohol use: Yes     Alcohol/week: 0.0 standard drinks    Comment: 4-5 drinks weekly   Marital Status: Married   Review of Systems  Cardiovascular:  Positive for chest pain. Negative for dyspnea on exertion and leg swelling.  Musculoskeletal:  Positive for arthritis (left knee, neck), neck pain and stiffness (right hand).  Gastrointestinal:  Negative for melena.   Objective  Blood pressure 114/83, pulse 85, temperature 98.5 F (36.9 C), temperature source Temporal, resp. rate 16, height _0  (1.778 m), weight 254 lb 12.8 oz (115.6 kg), SpO2 96 %. Body mass index is 36.56 kg/m.   Vitals with BMI 06/13/2021 01/15/2021 12/06/2020  Height _1  _2  -  Weight 254 lbs 13 oz 246 lbs 13 oz -  BMI 345.36346.80-  Systolic [id number] Diastolic 83 87 78  Pulse 85 83 71     Physical Exam Constitutional:      General: He is not in acute distress.    Appearance: He is well-developed.     Comments: Moderately obese  Neck:     Thyroid: No thyromegaly.     Vascular: No JVD.  Cardiovascular:     Rate and Rhythm: Normal rate and regular rhythm.     Pulses: Intact distal pulses.     Heart sounds: Normal heart sounds. No murmur heard.   No gallop.  Pulmonary:     Effort: Pulmonary effort is normal.     Breath sounds: Normal breath sounds.  Abdominal:     General: Bowel sounds are normal.     Palpations: Abdomen is soft.  Musculoskeletal:     Cervical back: Neck supple.   Radiology: No results found.  Laboratory examination:   CMP Latest Ref Rng & Units 06/08/2020  Glucose 65 - 99 mg/dL 84  BUN 6 - 24 mg/dL 19  Creatinine 0.76 - 1.27 mg/dL 1.30(H)  Sodium 134 - 144 mmol/L 138  Potassium 3.5 - 5.2 mmol/L 4.4  Chloride 96 - 106 mmol/L 100  CO2 20 - 29 mmol/L 21  Calcium 8.7 - 10.2 mg/dL 9.9  Total Protein 6.0 - 8.5 g/dL 7.4  Total Bilirubin 0.0 - 1.2 mg/dL 0.4  Alkaline Phos 48 - 121 IU/L 90  AST 0 - 40 IU/L 27  ALT 0 - 44 IU/L 34   No flowsheet data found. Lipid Panel     Component Value  Date/Time   CHOL 126 06/08/2020 0901   TRIG 124 06/08/2020 0901   HDL 50 06/08/2020 0901   LDLCALC 54 06/08/2020 0901   HEMOGLOBIN A1C Lab Results  Component Value Date   HGBA1C 5.8 (H) 06/08/2020   TSH No results for input(s): TSH in the last 8760 hours.    Labs 01/11/2019: Serum glucose 87 mg, BUN 16, creatinine 1.33, eGFR 68 mL.  08/06/2018: TSH 2.79.  07/29/2018: Hemoglobin A1c 5.7%.  Uric acid 6.9.  03/31/2018: Creatinine 1.27, EGFR 62/72, potassium 4.3, CMP normal.  Cholesterol 136, triglycerides 161, HDL 51, LDL 53.  Hemoglobin A1c 6.4%.  06/23/2017: Cholesterol 126, triglycerides 146, HDL 40, LDL 57.  Creatinine 1.2, potassium 4.3, CMP normal.  Uric acid normal at 7.5.  CMP Latest Ref Rng & Units 06/08/2020  Glucose 65 - 99 mg/dL 84  BUN 6 - 24 mg/dL 19  Creatinine 0.76 - 1.27 mg/dL 1.30(H)  Sodium 134 - 144 mmol/L 138  Potassium 3.5 - 5.2 mmol/L 4.4  Chloride 96 - 106 mmol/L 100  CO2 20 - 29 mmol/L 21  Calcium 8.7 - 10.2 mg/dL 9.9  Total Protein 6.0 - 8.5 g/dL 7.4  Total Bilirubin 0.0 - 1.2 mg/dL 0.4  Alkaline Phos 48 - 121 IU/L 90  AST 0 - 40 IU/L 27  ALT 0 - 44 IU/L 34   No flowsheet data found. Lipid Panel     Component Value Date/Time   CHOL 126 06/08/2020 0901   TRIG 124 06/08/2020 0901   HDL 50 06/08/2020 0901   LDLCALC 54 06/08/2020 0901   HEMOGLOBIN A1C Lab Results  Component Value Date   HGBA1C 5.8 (H) 06/08/2020   TSH No results for input(s): TSH in the last 8760 hours.   Medications   Current Outpatient Medications  Medication Instructions   allopurinol (ZYLOPRIM) 100 mg, Oral, Daily   aspirin EC 81 mg, Oral, Daily   buPROPion (WELLBUTRIN SR) 150 mg, Oral, 2 times daily, AM and 3 pm.   colchicine 0.6 mg, Oral, Daily   eplerenone (INSPRA) 50 MG tablet No dose, route, or frequency recorded.   fexofenadine (ALLEGRA) 180 mg, Oral, Daily, As needed    fluticasone (FLOVENT DISKUS) 50 MCG/BLIST diskus inhaler 1 puff, Inhalation, Daily, One  spray each nostril daily as needed    hydrALAZINE (APRESOLINE) 25 mg, Oral, 3 times daily PRN, For SBP >140/80 mm Hg   meloxicam (MOBIC) 15 mg, Oral, Daily   metoprolol succinate (TOPROL-XL) 50 mg,  Oral, Daily PRN, Take with or immediately following a meal.   mometasone (NASONEX) 50 MCG/ACT nasal spray TWO SPRAYS EACH NOSTRIL DAILY   NON FORMULARY Honey ginger with lime    phentermine 30 mg, Oral, BH-each morning   probenecid (BENEMID) 500 mg, Oral, Daily   rosuvastatin (CRESTOR) 20 MG tablet TAKE 1 TABLET BY MOUTH DAILY   sildenafil (VIAGRA) 100 mg, Oral, Daily PRN   valsartan-hydrochlorothiazide (DIOVAN-HCT) 320-12.5 MG tablet TAKE ONE TABLET BY MOUTH EVERY MORNING   verapamil (VERELAN PM) 180 MG 24 hr capsule TAKE 1 CAPSULE BY MOUTH EVERY EVENING AFTER DINNER   Vitamin D (Ergocalciferol) (DRISDOL) 50,000 Units, Oral, Every 7 days    Medications after present encounter:  Current Outpatient Medications  Medication Instructions   allopurinol (ZYLOPRIM) 100 mg, Oral, Daily   aspirin EC 81 mg, Oral, Daily   buPROPion (WELLBUTRIN SR) 150 mg, Oral, 2 times daily, AM and 3 pm.   colchicine 0.6 mg, Oral, Daily   eplerenone (INSPRA) 50 MG tablet No dose, route, or frequency recorded.   fexofenadine (ALLEGRA) 180 mg, Oral, Daily, As needed    fluticasone (FLOVENT DISKUS) 50 MCG/BLIST diskus inhaler 1 puff, Inhalation, Daily, One spray each nostril daily as needed    hydrALAZINE (APRESOLINE) 25 mg, Oral, 3 times daily PRN, For SBP >140/80 mm Hg   meloxicam (MOBIC) 15 mg, Oral, Daily   metoprolol succinate (TOPROL-XL) 50 mg, Oral, Daily PRN, Take with or immediately following a meal.   mometasone (NASONEX) 50 MCG/ACT nasal spray TWO SPRAYS EACH NOSTRIL DAILY   NON FORMULARY Honey ginger with lime    phentermine 30 mg, Oral, BH-each morning   probenecid (BENEMID) 500 mg, Oral, Daily   rosuvastatin (CRESTOR) 20 MG tablet TAKE 1 TABLET BY MOUTH DAILY   sildenafil (VIAGRA) 100 mg, Oral, Daily PRN    valsartan-hydrochlorothiazide (DIOVAN-HCT) 320-12.5 MG tablet TAKE ONE TABLET BY MOUTH EVERY MORNING   verapamil (VERELAN PM) 180 MG 24 hr capsule TAKE 1 CAPSULE BY MOUTH EVERY EVENING AFTER DINNER   Vitamin D (Ergocalciferol) (DRISDOL) 50,000 Units, Oral, Every 7 days    Cardiac Studies:   CARDIAC STUDIES:  Exercise sestamibi stress test 01/26/2015: 1. The resting electrocardiogram demonstrated normal sinus rhythm, normal resting conduction, no resting arrhythmias and normal rest repolarization. Low voltage.   The stress electrocardiogram revealed 1 mm of upsloping ST depression in lead (s):II, III, aVF, V5, V6 which was back to base at < 2 minutes into recovery. The patient performed treadmill exercise using a Bruce protocol, completing 7 minutes, an estimated workload of 7.3 METS. The heart rate was 90% MPHR. The stress test was terminated because of fatigue and dyspnea. 2. There is suggestion of very mild mid inferior to inferoapical ischemia. Gated SPECT images reveal normal myocardial thickening and wall motion.  The left ventricular ejection fraction was calculated or visually estimated to be 57%, normal.  Overall this represents a low risk study, clinical correlation recommended the patient weighs 270 pounds.  Echo- 01/30/15 1. Left ventricle cavity is normal in size. Mild to moderate concentric hypertrophy of the left ventricle. Normal global wall motion. Calculated EF 55%. 2. Right atrial cavity is slightly dilated. 3. Right ventricle cavity is slightly dilated. Normal right ventricular function. 4. Trace mitral regurgitation. 5. Trace tricuspid regurgitation. No evidence of pulmonary hypertension.  EKG:  EKG 06/13/2021: Normal sinus rhythm at a rate of 72 bpm, normal axis.  No evidence of ischemia, normal EKG.  No change from 12/06/2020.  Assessment  ICD-10-CM   1. Essential hypertension  I10 EKG 12-Lead    Ambulatory referral to [facility]    2. Hyperglycemia  R73.9  Ambulatory referral to [facility]    3. Atypical chest pain  R07.89     4. Class 2 severe obesity due to excess calories with serious comorbidity and body mass index (BMI) of 35.0 to 35.9 in adult (HCC)  E66.01 phentermine 30 MG capsule   Z68.35       Recommendations:   Aroldo Galli  is a 60 y.o.  male  with  hypertension, hyperlipidemia, hyperglycemia, prediabetes, mild obesity, family history of premature is CAD, gout, chronic corpulmonale and OSA on CPAP, atypical chest pain with positive nuclear stress test in February 2016 revealing abnormal EKG response to treadmill stress test along with inferior apical ischemia.  His chest pain on the left upper part of the chest where he points with 1 finger is clearly musculoskeletal.  He is otherwise doing well and no clinical evidence of heart failure.  Since cervical spine surgery, his right arm weakness has essentially resolved and has minor paresthesia in his right thumb.  Blood pressure is well controlled, obesity continues to be a major issue.  Previously he had tried phentermine but could not tolerate due to tachycardia but he is willing to try this again.  I would like to see him back in 3 months for follow-up.  Reviewed his labs, lipids under excellent control.  He has mild hyperglycemia.  He does not have a PCP, I will forward my note to Dr. Bernerd Limbo to establish himself for primary care needs.   Adrian Prows, MD, [facility] 06/15/2021, 11:01 AM Office: [phone] Pager: [phone]

## 2021-06-15 ENCOUNTER — Encounter: Payer: Self-pay | Admitting: Cardiology

## 2021-07-22 NOTE — Progress Notes (Signed)
NEUROLOGY FOLLOW UP OFFICE NOTE  Jeff Brewer [id number]  Assessment/Plan:   Cervical myelopathy s/p cervical decompression - doing well.  Follow up 6 months.  Subjective:  Jeff Brewer is a 60 year old right-handed male with HTN, DM2, HLD who follows up for cervical myelopathy   UPDATE: Doing well.  He is starting to get sensation in his right hand but it feels strange - if he washes his hands, it feels "like rubber".  Hand feels tight but strength intact.  No trouble with gait and balance.  Sometimes has electric-like pain down the right side of his neck and into the shoulder.    HISTORY: In August 2021, he underwent left knee replacement.  Immediately afterwards, he noticed numbness in the finger tips of the first 3 digits of both hands.  About 4 weeks ago, he developed complete numbness of the first 3 digits of the right hand, radiating up the lateral arm to the shoulder.  He also reports pain in the thenar aspect of his left hand.  No weakness but due to the numbness, he cannot use his hand such as typing and writing.  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  He had a NCV-EMG performed on 10/01/2020 which was normal.  He underwent a carpal tunnel steroid injection which was ineffective.  MRI of cervical spine without contrast on 10/25/2020 showed multilevel spondylosis with spinal stenosis from C3-5 through C6-7, most severe at C5-6 with myelomalacia at C4-6 and focal cord hyperintensity at C4 level.  Concern that this represented a demyelinating lesion.  Follow up post-contrast MRI of cervical spine on 10/30/2020 showed enhancement of this lesion, suspicious for acute demyelination.  He had an MRI of the brain with and without contrast on 11/13/2020 which was unremarkable.  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  He underwent ACDF 3 weeks  ago.  He has already noticed improvement.  Numbness in left hand completely resolved.  Numbness in right hand resolved except for tingling in the first 3 fingers.  He is able to write, drive and dress again.  Some neck tightness.  PAST MEDICAL HISTORY: Past Medical History:  Diagnosis Date   Atypical chest pain    Diabetes mellitus without complication (HCC)    Essential hypertension 02/07/2019   Fatigue 02/08/2019   Gout    Hypercholesteremia    Hyperglycemia 02/07/2019   Hyperlipidemia 02/07/2019   Hypertension    Obesity    moderate   Obstructive sleep apnea 02/08/2019   Resolved with weight loss   OSA (obstructive sleep apnea)     MEDICATIONS: Current Outpatient Medications on File Prior to Visit  Medication Sig Dispense Refill   allopurinol (ZYLOPRIM) 100 MG tablet Take 1 tablet (100 mg total) by mouth daily. 90 tablet 0   aspirin EC 81 MG tablet Take 81 mg by mouth daily.     buPROPion (WELLBUTRIN SR) 150 MG 12 hr tablet Take 1 tablet (150 mg total) by mouth in the morning and at bedtime. AM and 3 pm. 60 tablet 6   colchicine 0.6 MG tablet Take 0.6 mg by mouth daily.     eplerenone (INSPRA) 50 MG tablet   0   fexofenadine (ALLEGRA) 180 MG tablet Take 180 mg by mouth daily. As needed     fluticasone (FLOVENT DISKUS) 50 MCG/BLIST diskus inhaler Inhale 1 puff into the lungs  daily. One spray each nostril daily as needed     hydrALAZINE (APRESOLINE) 25 MG tablet Take 1 tablet (25 mg total) by mouth 3 (three) times daily as needed. For SBP >140/80 mm Hg 180 tablet 0   meloxicam (MOBIC) 15 MG tablet Take 15 mg by mouth daily.     metoprolol succinate (TOPROL-XL) 50 MG 24 hr tablet Take 50 mg by mouth daily as needed (For SBP >130 mm Hg). Take with or immediately following a meal. 90 tablet 3   mometasone (NASONEX) 50 MCG/ACT nasal spray TWO SPRAYS EACH NOSTRIL DAILY 17 g 12   NON FORMULARY Honey ginger with lime     phentermine 30 MG capsule Take 1 capsule (30 mg total) by mouth every  morning. 30 capsule 2   probenecid (BENEMID) 500 MG tablet Take 1 tablet (500 mg total) by mouth daily. 90 tablet 0   rosuvastatin (CRESTOR) 20 MG tablet TAKE 1 TABLET BY MOUTH DAILY 90 tablet 0   sildenafil (VIAGRA) 100 MG tablet Take 100 mg by mouth daily as needed for erectile dysfunction.     valsartan-hydrochlorothiazide (DIOVAN-HCT) 320-12.5 MG tablet TAKE ONE TABLET BY MOUTH EVERY MORNING 90 tablet 3   verapamil (VERELAN PM) 180 MG 24 hr capsule TAKE 1 CAPSULE BY MOUTH EVERY EVENING AFTER DINNER 90 capsule 2   Vitamin D, Ergocalciferol, (DRISDOL) 50000 UNITS CAPS capsule Take 50,000 Units by mouth every 7 (seven) days.     No current facility-administered medications on file prior to visit.    ALLERGIES: Allergies  Allergen Reactions   Aspirin Rash   Sulfa Antibiotics Rash    FAMILY HISTORY: Family History  Problem Relation Age of Onset   Heart Problems Mother    Heart Problems Father        known hole in heart      Objective:  Blood pressure 134/83, pulse 94, height 5\' 10"  (1.778 m), weight 253 lb (114.8 kg), SpO2 96 %. General: No acute distress.  Patient appears well-groomed.   Head:  Normocephalic/atraumatic Eyes:  Fundi examined but not visualized Neck: supple, no paraspinal tenderness, full range of motion Heart:  Regular rate and rhythm Lungs:  Clear to auscultation bilaterally Back: No paraspinal tenderness Neurological Exam: alert and oriented to person, place, and time.  Speech fluent and not dysarthric, language intact.  CN II-XII intact. Bulk and tone normal, muscle strength 5/5 throughout.  Sensation to light touch, pinprick and vibration intact.  Deep tendon reflexes 3+ patellars bilaterally, otherwise 2+ througout; toes downgoing.  Finger to nose testing intact.  Gait normal, able to tandem walk.  Romberg negative.   , DO  CC:  Shon Millet, MD

## 2021-07-23 ENCOUNTER — Other Ambulatory Visit: Payer: Self-pay

## 2021-07-23 ENCOUNTER — Encounter: Payer: Self-pay | Admitting: Neurology

## 2021-07-23 ENCOUNTER — Ambulatory Visit (INDEPENDENT_AMBULATORY_CARE_PROVIDER_SITE_OTHER): Payer: BC Managed Care – PPO | Admitting: Neurology

## 2021-07-23 VITALS — BP 134/83 | HR 94 | Ht 70.0 in | Wt 253.0 lb

## 2021-07-23 DIAGNOSIS — G959 Disease of spinal cord, unspecified: Secondary | ICD-10-CM | POA: Diagnosis not present

## 2021-11-05 ENCOUNTER — Other Ambulatory Visit: Payer: Self-pay | Admitting: Cardiology

## 2021-11-27 ENCOUNTER — Ambulatory Visit: Payer: BC Managed Care – PPO | Admitting: Cardiology

## 2021-12-05 ENCOUNTER — Other Ambulatory Visit: Payer: Self-pay

## 2021-12-05 ENCOUNTER — Ambulatory Visit: Payer: BC Managed Care – PPO | Admitting: Cardiology

## 2021-12-05 ENCOUNTER — Encounter: Payer: Self-pay | Admitting: Cardiology

## 2021-12-05 VITALS — BP 132/76 | HR 99 | Temp 97.8°F | Resp 17 | Ht 70.0 in | Wt 246.0 lb

## 2021-12-05 DIAGNOSIS — E6609 Other obesity due to excess calories: Secondary | ICD-10-CM

## 2021-12-05 DIAGNOSIS — I10 Essential (primary) hypertension: Secondary | ICD-10-CM

## 2021-12-05 DIAGNOSIS — G379 Demyelinating disease of central nervous system, unspecified: Secondary | ICD-10-CM | POA: Insufficient documentation

## 2021-12-05 DIAGNOSIS — F5101 Primary insomnia: Secondary | ICD-10-CM

## 2021-12-05 DIAGNOSIS — E7800 Pure hypercholesterolemia, unspecified: Secondary | ICD-10-CM

## 2021-12-05 MED ORDER — ROSUVASTATIN CALCIUM 20 MG PO TABS: ORAL_TABLET | ORAL | 3 refills | Status: DC

## 2021-12-05 MED ORDER — VALSARTAN-HYDROCHLOROTHIAZIDE 320-12.5 MG PO TABS: 1.0000 | ORAL_TABLET | Freq: Every morning | ORAL | 3 refills | Status: DC

## 2021-12-05 MED ORDER — TEMAZEPAM 15 MG PO CAPS: 15.0000 mg | ORAL_CAPSULE | Freq: Every evening | ORAL | 3 refills | Status: DC | PRN

## 2021-12-05 MED ORDER — METOPROLOL SUCCINATE ER 50 MG PO TB24: 50.0000 mg | ORAL_TABLET | Freq: Every day | ORAL | 3 refills | Status: DC | PRN

## 2021-12-05 NOTE — Progress Notes (Signed)
Primary Physician/Referring:  Tracey Harries, MD  Patient ID: Jeff Brewer, male    DOB: 02-01-1961, 60 y.o.   MRN: [id number]  Chief Complaint  Patient presents with   Hypertension   Hyperlipidemia   Obesity    6 MONTH   HPI: Jeff Brewer  is a 60 y.o. male with  hypertension, hyperlipidemia, hyperglycemia, prediabetes, mild obesity, family history of premature is CAD, gout, chronic corpulmonale and OSA on CPAP, atypical chest pain with positive nuclear stress test in February 2016 revealing abnormal EKG response to treadmill stress test along with inferior apical ischemia.  This is 76-month office visit.  Since cervical spine surgery on 04/19/2020, he has felt well, and has only mild resto tingling and numbness in his right thumb.  He is presently asymptomatic from cardiac standpoint.    Past Medical History:  Diagnosis Date   Atypical chest pain    Diabetes mellitus without complication (HCC)    Essential hypertension 02/07/2019   Fatigue 02/08/2019   Gout    Hypercholesteremia    Hyperglycemia 02/07/2019   Hyperlipidemia 02/07/2019   Hypertension    Obesity    moderate   Obstructive sleep apnea 02/08/2019   Resolved with weight loss   OSA (obstructive sleep apnea)     Past Surgical History:  Procedure Laterality Date   APPENDECTOMY     age 1   ARTHRODESIS     Anterior Cervical arthrodesis and insertion of interbody Bio Cev with anterior fusion 12/19/2020   ARTHROSCOPIC REPAIR ACL Left    1989   ARTHROSCOPIC REPAIR ACL Right 12/22/1992   Social History   Tobacco Use   Smoking status: Former    Packs/day: 1.00    Types: Cigarettes    Quit date: 1990    Years since quitting: 32.9   Smokeless tobacco: Never   Tobacco comments:    quit 20 years ago  Substance Use Topics   Alcohol use: Yes    Alcohol/week: 0.0 standard drinks    Comment: 4-5 drinks weekly   Marital Status: Married   Review of Systems  Cardiovascular:  Negative for  chest pain, dyspnea on exertion and leg swelling.  Musculoskeletal:  Negative for arthritis.  Gastrointestinal:  Negative for melena.   Objective  Blood pressure 132/76, pulse 99, temperature 97.8 F (36.6 C), temperature source Temporal, resp. rate 17, height 5\' 10"  (1.778 m), weight 246 lb (111.6 kg), SpO2 95 %. Body mass index is 35.3 kg/m.   Vitals with BMI 12/05/2021 12/05/2021 07/23/2021  Height - 5\' 10"  5\' 10"   Weight - 246 lbs 253 lbs  BMI - 35.3 36.3  Systolic 132 146 09/22/2021  Diastolic 76 88 83  Pulse - 99 94     Physical Exam Constitutional:      Appearance: He is well-developed.     Comments: Moderately obese  Neck:     Thyroid: No thyromegaly.     Vascular: No carotid bruit or JVD.  Cardiovascular:     Rate and Rhythm: Normal rate and regular rhythm.     Pulses: Intact distal pulses.     Heart sounds: Normal heart sounds. No murmur heard.   No gallop.  Pulmonary:     Effort: Pulmonary effort is normal.     Breath sounds: Normal breath sounds.  Abdominal:     General: Bowel sounds are normal.     Palpations: Abdomen is soft.  Musculoskeletal:     Cervical back: Neck supple.   Radiology: No results found.  Laboratory examination:   Labs 01/11/2019: Serum glucose 87 mg, BUN 16, creatinine 1.33, eGFR 68 mL.  08/06/2018: TSH 2.79.  External labs:   Labs 08/08/2021:  Glucose 65 - 99 mg/dL 122 High   BUN 8 - 27 mg/dL 13  Creatinine 0.76 - 1.27 mg/dL 1.14  eGFR >59 mL/min/1.73 74  BUN/Creatinine Ratio 10 - 24 11  Sodium 134 - 144 mmol/L 140  Potassium 3.5 - 5.2 mmol/L 3.8   Cholesterol, Total 100 - 199 mg/dL 127  Triglycerides 0 - 149 mg/dL 259 High   HDL >39 mg/dL 37 Low   VLDL Cholesterol Cal 5 - 40 mg/dL 41 High   LDL 0 - 99 mg/dL 49   Vit D, 25-Hydroxy 30.0 - 100.0 ng/mL 26.8 Low    Hemoglobin A1c 4.8 - 5.6 % 6.0 Abnormal      Medications   Current Outpatient Medications  Medication Instructions   allopurinol (ZYLOPRIM) 100 mg, Oral, Daily    aspirin EC 81 mg, Oral, Daily   colchicine 0.6 mg, Oral, Daily   fexofenadine (ALLEGRA) 180 mg, Oral, As needed, As needed    hydrALAZINE (APRESOLINE) 25 mg, Oral, 3 times daily PRN, For SBP >140/80 mm Hg   meloxicam (MOBIC) 7.5 mg, Oral, 2 times daily PRN   metoprolol succinate (TOPROL-XL) 50 mg, Oral, Daily PRN, Take with or immediately following a meal.   Naltrexone-buPROPion HCl ER 8-90 MG TB12 1 in am for 1 week, then 1 twice daily for 1 week, then 2 in am and 1 in pm for 1 week, then 2 twice daily   NON FORMULARY Honey ginger with lime    probenecid (BENEMID) 500 MG tablet Oral   probenecid (BENEMID) 500 mg, Oral, Daily   rosuvastatin (CRESTOR) 20 MG tablet TAKE ONE (1) TABLET BY MOUTH EACH DAY   sildenafil (VIAGRA) 100 mg, Oral, Daily PRN   temazepam (RESTORIL) 15 mg, Oral, At bedtime PRN   triamcinolone (KENALOG) 0.025 % cream Topical, 2 times daily PRN   valsartan-hydrochlorothiazide (DIOVAN-HCT) 320-12.5 MG tablet 1 tablet, Oral, Every morning   verapamil (VERELAN PM) 180 MG 24 hr capsule TAKE 1 CAPSULE BY MOUTH EVERY EVENING AFTER DINNER   Vitamin D (Ergocalciferol) (DRISDOL) 50,000 Units, Oral, Every 7 days    Medications after present encounter:  Current Outpatient Medications  Medication Instructions   allopurinol (ZYLOPRIM) 100 mg, Oral, Daily   aspirin EC 81 mg, Oral, Daily   colchicine 0.6 mg, Oral, Daily   fexofenadine (ALLEGRA) 180 mg, Oral, As needed, As needed    hydrALAZINE (APRESOLINE) 25 mg, Oral, 3 times daily PRN, For SBP >140/80 mm Hg   meloxicam (MOBIC) 7.5 mg, Oral, 2 times daily PRN   metoprolol succinate (TOPROL-XL) 50 mg, Oral, Daily PRN, Take with or immediately following a meal.   Naltrexone-buPROPion HCl ER 8-90 MG TB12 1 in am for 1 week, then 1 twice daily for 1 week, then 2 in am and 1 in pm for 1 week, then 2 twice daily   NON FORMULARY Honey ginger with lime    probenecid (BENEMID) 500 MG tablet Oral   probenecid  (BENEMID) 500 mg, Oral, Daily   rosuvastatin (CRESTOR) 20 MG tablet TAKE ONE (1) TABLET BY MOUTH EACH DAY   sildenafil (VIAGRA) 100 mg, Oral, Daily PRN   temazepam (RESTORIL) 15 mg, Oral, At bedtime PRN   triamcinolone (KENALOG) 0.025 % cream Topical, 2 times daily PRN   valsartan-hydrochlorothiazide (DIOVAN-HCT) 320-12.5 MG tablet 1 tablet, Oral, Every morning  verapamil (VERELAN PM) 180 MG 24 hr capsule TAKE 1 CAPSULE BY MOUTH EVERY EVENING AFTER DINNER   Vitamin D (Ergocalciferol) (DRISDOL) 50,000 Units, Oral, Every 7 days    Cardiac Studies:   CARDIAC STUDIES:  Exercise sestamibi stress test 01/26/2015: 1. The resting electrocardiogram demonstrated normal sinus rhythm, normal resting conduction, no resting arrhythmias and normal rest repolarization. Low voltage.   The stress electrocardiogram revealed 1 mm of upsloping ST depression in lead (s):II, III, aVF, V5, V6 which was back to base at < 2 minutes into recovery. The patient performed treadmill exercise using a Bruce protocol, completing 7 minutes, an estimated workload of 7.3 METS. The heart rate was 90% MPHR. The stress test was terminated because of fatigue and dyspnea. 2. There is suggestion of very mild mid inferior to inferoapical ischemia. Gated SPECT images reveal normal myocardial thickening and wall motion.  The left ventricular ejection fraction was calculated or visually estimated to be 57%, normal.  Overall this represents a low risk study, clinical correlation recommended the patient weighs 270 pounds.  Echo- 01/30/15 1. Left ventricle cavity is normal in size. Mild to moderate concentric hypertrophy of the left ventricle. Normal global wall motion. Calculated EF 55%. 2. Right atrial cavity is slightly dilated. 3. Right ventricle cavity is slightly dilated. Normal right ventricular function. 4. Trace mitral regurgitation. 5. Trace tricuspid regurgitation. No evidence of pulmonary hypertension.  EKG:  EKG 04/03/2021:  Normal sinus rhythm at rate of 97 bpm, leftward axis.  Incomplete right bundle branch block.  No evidence of ischemia.  Assessment     ICD-10-CM   1. Essential hypertension  I10 EKG 12-Lead    metoprolol succinate (TOPROL-XL) 50 MG 24 hr tablet    rosuvastatin (CRESTOR) 20 MG tablet    valsartan-hydrochlorothiazide (DIOVAN-HCT) 320-12.5 MG tablet    2. Pure hypercholesterolemia  E78.00     3. Class 2 obesity due to excess calories without serious comorbidity with body mass index (BMI) of 35.0 to 35.9 in adult  E66.09    Z68.35     4. Primary insomnia  F51.01 temazepam (RESTORIL) 15 MG capsule      Recommendations:   Jeff Brewer  is a 60 y.o.  male  with  hypertension, hyperlipidemia, hyperglycemia, prediabetes, mild obesity, family history of premature is CAD, gout, chronic corpulmonale and OSA on CPAP, atypical chest pain with positive nuclear stress test in February 2016 revealing abnormal EKG response to treadmill stress test along with inferior apical ischemia.  He has now established with Dr. Bernerd Limbo.  He is presently asymptomatic.  No change in physical exam from previous, he has recuperated well from cervical spine surgery.  We discussed regarding resuming physical activity and exercise.  Blood pressure is well controlled and lipids reviewed.  No changes in the medications were done today.  We discussed primary prevention strategies and dietary changes.  I will see him back in a year.    Jeff Prows, MD, [facility] 12/07/2021, 8:03 AM Office: [phone] Pager: [phone]

## 2021-12-07 ENCOUNTER — Encounter: Payer: Self-pay | Admitting: Cardiology

## 2022-01-07 ENCOUNTER — Other Ambulatory Visit: Payer: Self-pay | Admitting: Otolaryngology

## 2022-01-07 DIAGNOSIS — J329 Chronic sinusitis, unspecified: Secondary | ICD-10-CM

## 2022-01-20 ENCOUNTER — Other Ambulatory Visit: Payer: Self-pay | Admitting: Cardiology

## 2022-02-04 ENCOUNTER — Ambulatory Visit: Payer: BC Managed Care – PPO | Admitting: Neurology

## 2022-04-07 ENCOUNTER — Other Ambulatory Visit: Payer: BC Managed Care – PPO

## 2022-04-17 NOTE — Progress Notes (Signed)
? ?NEUROLOGY FOLLOW UP OFFICE NOTE ? ?Jeff Brewer ?[id number] ? ?Assessment/Plan:  ? ?Cervical myelopathy s/p cervical decompression - doing well. ?  ?Follow up as needed. ?  ?Subjective:  ?Jeff Brewer is a 61 year old right-handed male with HTN, DM2, HLD who follows up for cervical myelopathy ?  ?UPDATE: ?Feeling well.  Cervical neuralgia resolved.  Some mild right shoulder problem.  Right hand very mildly feels tight.   ?  ?HISTORY: ?In August 2021, he underwent left knee replacement.  Immediately afterwards, he noticed numbness in the finger tips of the first 3 digits of both hands.  About 4 weeks ago, he developed complete numbness of the first 3 digits of the right hand, radiating up the lateral arm to the shoulder.  He also reports pain in the thenar aspect of his left hand.  No weakness but due to the numbness, he cannot use his hand such as typing and writing.  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  He had a NCV-EMG performed on 10/01/2020 which was normal.  He underwent a carpal tunnel steroid injection which was ineffective.  MRI of cervical spine without contrast on 10/25/2020 showed multilevel spondylosis with spinal stenosis from C3-5 through C6-7, most severe at C5-6 with myelomalacia at C4-6 and focal cord hyperintensity at C4 level.  Concern that this represented a demyelinating lesion.  Follow up post-contrast MRI of cervical spine on 10/30/2020 showed enhancement of this lesion, suspicious for acute demyelination.  He had an MRI of the brain with and without contrast on 11/13/2020 which was unremarkable.  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  He underwent ACDF 3 weeks ago.  He has already noticed improvement.  Numbness in left hand completely resolved.  Numbness in right hand resolved except for tingling in the first 3 fingers.  He is able to  write, drive and dress again.  Some neck tightness. ? ?PAST MEDICAL HISTORY: ?Past Medical History:  ?Diagnosis Date  ? Atypical chest pain   ? Diabetes mellitus without complication ([place])   ? Essential hypertension 02/07/2019  ? Fatigue 02/08/2019  ? Gout   ? Hypercholesteremia   ? Hyperglycemia 02/07/2019  ? Hyperlipidemia 02/07/2019  ? Hypertension   ? Obesity   ? moderate  ? Obstructive sleep apnea 02/08/2019  ? Resolved with weight loss  ? OSA (obstructive sleep apnea)   ? ? ?MEDICATIONS: ?Current Outpatient Medications on File Prior to Visit  ?Medication Sig Dispense Refill  ? allopurinol (ZYLOPRIM) 100 MG tablet Take 1 tablet (100 mg total) by mouth daily. 90 tablet 0  ? aspirin EC 81 MG tablet Take 81 mg by mouth daily.    ? colchicine 0.6 MG tablet Take 0.6 mg by mouth daily.    ? fexofenadine (ALLEGRA) 180 MG tablet Take 180 mg by mouth as needed. As needed    ? hydrALAZINE (APRESOLINE) 25 MG tablet Take 1 tablet (25 mg total) by mouth 3 (three) times daily as needed. For SBP >140/80 mm Hg 180 tablet 0  ? meloxicam (MOBIC) 7.5 MG tablet Take 7.5 mg by mouth 2 (two) times daily as needed.    ? metoprolol succinate (TOPROL-XL) 50 MG 24 hr tablet Take 1 tablet (50 mg total) by mouth daily as needed (For SBP >130 mm Hg). Take with or immediately following a meal. 90 tablet 3  ? Naltrexone-buPROPion HCl ER  8-90 MG TB12 1 in am for 1 week, then 1 twice daily for 1 week, then 2 in am and 1 in pm for 1 week, then 2 twice daily    ? NON FORMULARY Honey ginger with lime    ? probenecid (BENEMID) 500 MG tablet Take 1 tablet (500 mg total) by mouth daily. 90 tablet 0  ? probenecid (BENEMID) 500 MG tablet Take by mouth.    ? rosuvastatin (CRESTOR) 20 MG tablet TAKE ONE (1) TABLET BY MOUTH EACH DAY 90 tablet 3  ? sildenafil (VIAGRA) 100 MG tablet Take 100 mg by mouth daily as needed for erectile dysfunction.    ? temazepam (RESTORIL) 15 MG capsule Take 1 capsule (15 mg total) by mouth at bedtime as needed for sleep. 30  capsule 3  ? triamcinolone (KENALOG) 0.025 % cream Apply topically 2 (two) times daily as needed.    ? valsartan-hydrochlorothiazide (DIOVAN-HCT) 320-12.5 MG tablet Take 1 tablet by mouth every morning. 90 tablet 3  ? verapamil (VERELAN PM) 180 MG 24 hr capsule TAKE ONE CAPSULE BY MOUTH EVERY EVENING AFTER DINNER. 90 capsule 0  ? Vitamin D, Ergocalciferol, (DRISDOL) 50000 UNITS CAPS capsule Take 50,000 Units by mouth every 7 (seven) days.    ? ?No current facility-administered medications on file prior to visit.  ? ? ?ALLERGIES: ?Allergies  ?Allergen Reactions  ? Aspirin Rash  ? Sulfa Antibiotics Rash  ? ? ?FAMILY HISTORY: ?Family History  ?Problem Relation Age of Onset  ? Heart Problems Mother   ? Heart Problems Father   ?     known hole in heart  ? ? ?  ?Objective:  ?Blood pressure (!) 150/76, pulse 83, temperature (!) 95 ?F (35 ?C), weight 254 lb 12.8 oz (115.6 kg). ?General: No acute distress.  Patient appears well-groomed.   ?Head:  Normocephalic/atraumatic ?Eyes:  Fundi examined but not visualized ?Neck: supple, no paraspinal tenderness, full range of motion ?Heart:  Regular rate and rhythm ?Neurological Exam:  alert and oriented to person, place, and time.  Speech fluent and not dysarthric, language intact.  CN II-XII intact. Bulk and tone normal, muscle strength 5/5 throughout.  Sensation to pinprick and vibration intact.  Deep tendon reflexes 3+ patellars bilaterally, otherwise 2+ througout; toes downgoing.  Finger to nose testing intact.  Gait normal, able to tandem walk.  Romberg negative. ? ? ?Metta Clines, DO ? ?CC: Bernerd Limbo, MD ? ? ? ? ? ? ?

## 2022-04-21 ENCOUNTER — Encounter: Payer: Self-pay | Admitting: Neurology

## 2022-04-21 ENCOUNTER — Ambulatory Visit: Payer: BC Managed Care – PPO | Admitting: Neurology

## 2022-04-21 VITALS — BP 150/76 | HR 83 | Temp 95.0°F | Ht 70.0 in | Wt 254.8 lb

## 2022-04-21 DIAGNOSIS — G959 Disease of spinal cord, unspecified: Secondary | ICD-10-CM | POA: Diagnosis not present

## 2022-04-21 NOTE — Patient Instructions (Signed)
Follow up as needed

## 2022-05-20 ENCOUNTER — Other Ambulatory Visit: Payer: Self-pay | Admitting: Cardiology

## 2022-07-07 ENCOUNTER — Other Ambulatory Visit: Payer: BC Managed Care – PPO

## 2022-08-27 ENCOUNTER — Other Ambulatory Visit: Payer: Self-pay | Admitting: Cardiology

## 2022-12-04 ENCOUNTER — Encounter: Payer: Self-pay | Admitting: Cardiology

## 2022-12-04 ENCOUNTER — Ambulatory Visit: Payer: BC Managed Care – PPO | Admitting: Cardiology

## 2022-12-04 VITALS — BP 121/61 | HR 79 | Resp 16 | Ht 70.0 in | Wt 249.4 lb

## 2022-12-04 DIAGNOSIS — I10 Essential (primary) hypertension: Secondary | ICD-10-CM

## 2022-12-04 DIAGNOSIS — R739 Hyperglycemia, unspecified: Secondary | ICD-10-CM

## 2022-12-04 DIAGNOSIS — N522 Drug-induced erectile dysfunction: Secondary | ICD-10-CM

## 2022-12-04 DIAGNOSIS — F5101 Primary insomnia: Secondary | ICD-10-CM

## 2022-12-04 DIAGNOSIS — E7800 Pure hypercholesterolemia, unspecified: Secondary | ICD-10-CM

## 2022-12-04 MED ORDER — VALSARTAN 320 MG PO TABS: 320.0000 mg | ORAL_TABLET | Freq: Every day | ORAL | 3 refills | Status: DC

## 2022-12-04 MED ORDER — SILDENAFIL CITRATE 100 MG PO TABS: 100.0000 mg | ORAL_TABLET | Freq: Every day | ORAL | 2 refills | Status: DC | PRN

## 2022-12-04 MED ORDER — TEMAZEPAM 15 MG PO CAPS: 15.0000 mg | ORAL_CAPSULE | Freq: Every evening | ORAL | 5 refills | Status: DC | PRN

## 2022-12-04 NOTE — Progress Notes (Signed)
Primary Physician/Referring:  Bernerd Limbo, MD  Patient ID: Jeff Brewer, male    DOB: Jul 01, 1961, 61 y.o.   MRN: [id number]  Chief Complaint  Patient presents with   Hypertension   Hyperlipidemia   Follow-up    1 year   HPI: [name]  is a 61 y.o. male with  hypertension, hyperlipidemia, hyperglycemia, prediabetes, mild obesity, family history of premature is CAD, gout, chronic corpulmonale and OSA on CPAP, atypical chest pain with positive nuclear stress test in February 2016 revealing abnormal EKG response to treadmill stress test along with inferior apical ischemia.  This is a annual visit, he is presently doing well without any chest pain or dyspnea, he has been released by neurosurgery after his neck surgery to go back to exercises, he has started to go to the gym over the past 1 month and has had no issues with dyspnea or chest pain with physical activity.  Past Medical History:  Diagnosis Date   Atypical chest pain    Diabetes mellitus without complication ([place])    Essential hypertension 02/07/2019   Fatigue 02/08/2019   Gout    Hypercholesteremia    Hyperglycemia 02/07/2019   Hyperlipidemia 02/07/2019   Hypertension    Obesity    moderate   Obstructive sleep apnea 02/08/2019   Resolved with weight loss   OSA (obstructive sleep apnea)     Past Surgical History:  Procedure Laterality Date   APPENDECTOMY     age 60   ARTHRODESIS     Anterior Cervical arthrodesis and insertion of interbody Bio Cev with anterior fusion 12/19/2020   ARTHROSCOPIC REPAIR ACL Left    1989   ARTHROSCOPIC REPAIR ACL Right 12/22/1992   Social History   Tobacco Use   Smoking status: Former    Packs/day: 1.00    Types: Cigarettes    Quit date: 1990    Years since quitting: 33.9   Smokeless tobacco: Never   Tobacco comments:    quit 20 years ago  Substance Use Topics   Alcohol use: Yes    Alcohol/week: 0.0 standard drinks of alcohol    Comment: 4-5 drinks weekly   Marital  Status: Married   Review of Systems  Cardiovascular:  Negative for chest pain, dyspnea on exertion and leg swelling.    Objective  Blood pressure 121/61, pulse 79, resp. rate 16, height _0  (1.778 m), weight 249 lb 6.4 oz (113.1 kg), SpO2 95 %. Body mass index is 35.79 kg/m.      12/04/2022   11:06 AM 12/04/2022   10:56 AM 04/21/2022    9:45 AM  Vitals with BMI  Height  _1  _2   Weight  249 lbs 6 oz 254 lbs 13 oz  BMI  83.09 40.76  Systolic 808 811 031  Diastolic 61 85 76  Pulse 79 85 83     Physical Exam Constitutional:      Appearance: He is well-developed.     Comments: Moderately obese  Neck:     Thyroid: No thyromegaly.     Vascular: No carotid bruit or JVD.  Cardiovascular:     Rate and Rhythm: Normal rate and regular rhythm.     Pulses: Intact distal pulses.     Heart sounds: Normal heart sounds. No murmur heard.    No gallop.  Pulmonary:     Effort: Pulmonary effort is normal.     Breath sounds: Normal breath sounds.  Abdominal:     General: Bowel sounds are  normal.     Palpations: Abdomen is soft.    Radiology: No results found.  Laboratory examination:   External labs:  Labs 08/12/2022:  A1c 6.0%.  Vitamin D30.7.   Total cholesterol 126, triglycerides 232, HDL 34, LDL 54.  Serum glucose 90 mg, BUN 16, creatinine 1.0, EGFR 86 mL.  Potassium 3.9.  LFTs normal.  Labs 08/08/2021:  Glucose 65 - 99 mg/dL 122 High   BUN 8 - 27 mg/dL 13  Creatinine 0.76 - 1.27 mg/dL 1.14  eGFR >59 mL/min/1.73 74  BUN/Creatinine Ratio 10 - 24 11  Sodium 134 - 144 mmol/L 140  Potassium 3.5 - 5.2 mmol/L 3.8   Cholesterol, Total 100 - 199 mg/dL  127  Triglycerides 0 - 149 mg/dL    259 High   HDL >39 mg/dL 37 Low   VLDL Cholesterol Cal 5 - 40 mg/dL   41 High   LDL 0 - 99 mg/dL     49   Vit D, 25-Hydroxy 30.0 - 100.0 ng/mL   26.8 Low     Hemoglobin A1c 4.8 - 5.6 %     6.0 Abnormal      Medications and allergies:    Allergies  Allergen Reactions    Sulfa Antibiotics Rash    Current Outpatient Medications:    allopurinol (ZYLOPRIM) 100 MG tablet, Take 1 tablet (100 mg total) by mouth daily., Disp: 90 tablet, Rfl: 0   azelastine (ASTELIN) 0.1 % nasal spray, Place 2 sprays into both nostrils 2 (two) times daily., Disp: , Rfl:    colchicine 0.6 MG tablet, Take 0.6 mg by mouth daily., Disp: , Rfl:    fexofenadine (ALLEGRA) 180 MG tablet, Take 180 mg by mouth as needed. As needed, Disp: , Rfl:    hydrALAZINE (APRESOLINE) 25 MG tablet, Take 1 tablet (25 mg total) by mouth 3 (three) times daily as needed. For SBP >140/80 mm Hg, Disp: 180 tablet, Rfl: 0   meloxicam (MOBIC) 15 MG tablet, Take 15 mg by mouth daily as needed., Disp: , Rfl:    NON FORMULARY, Honey ginger with lime, Disp: , Rfl:    probenecid (BENEMID) 500 MG tablet, Take 1 tablet (500 mg total) by mouth daily., Disp: 90 tablet, Rfl: 0   rosuvastatin (CRESTOR) 20 MG tablet, TAKE ONE (1) TABLET BY MOUTH EACH DAY, Disp: 90 tablet, Rfl: 3   valsartan (DIOVAN) 320 MG tablet, Take 1 tablet (320 mg total) by mouth daily., Disp: 90 tablet, Rfl: 3   verapamil (VERELAN PM) 180 MG 24 hr capsule, TAKE ONE CAPSULE BY MOUTH EVERY EVENING AFTER dinner, Disp: 90 capsule, Rfl: 0   Vitamin D, Ergocalciferol, (DRISDOL) 50000 UNITS CAPS capsule, Take 50,000 Units by mouth every 7 (seven) days., Disp: , Rfl:    OZEMPIC, 1 MG/DOSE, 4 MG/3ML SOPN, Inject 1 mg into the skin once a week., Disp: , Rfl:    sildenafil (VIAGRA) 100 MG tablet, Take 1 tablet (100 mg total) by mouth daily as needed for erectile dysfunction., Disp: 20 tablet, Rfl: 2   temazepam (RESTORIL) 15 MG capsule, Take 1 capsule (15 mg total) by mouth at bedtime as needed for sleep., Disp: 30 capsule, Rfl: 5    Cardiac Studies:   CARDIAC STUDIES:  Exercise sestamibi stress test 01/26/2015: 1. The resting electrocardiogram demonstrated normal sinus rhythm, normal resting conduction, no resting arrhythmias and normal rest repolarization. Low  voltage.   The stress electrocardiogram revealed 1 mm of upsloping ST depression in lead (s):II, III, aVF, V5,  V6 which was back to base at < 2 minutes into recovery. The patient performed treadmill exercise using a Bruce protocol, completing 7 minutes, an estimated workload of 7.3 METS. The heart rate was 90% MPHR. The stress test was terminated because of fatigue and dyspnea. 2. There is suggestion of very mild mid inferior to inferoapical ischemia. Gated SPECT images reveal normal myocardial thickening and wall motion.  The left ventricular ejection fraction was calculated or visually estimated to be 57%, normal.  Overall this represents a low risk study, clinical correlation recommended the patient weighs 270 pounds.  Echo- 01/30/15 1. Left ventricle cavity is normal in size. Mild to moderate concentric hypertrophy of the left ventricle. Normal global wall motion. Calculated EF 55%. 2. Right atrial cavity is slightly dilated. 3. Right ventricle cavity is slightly dilated. Normal right ventricular function. 4. Trace mitral regurgitation. 5. Trace tricuspid regurgitation. No evidence of pulmonary hypertension.  EKG:  EKG 04/03/2021: Normal sinus rhythm at rate of 97 bpm, leftward axis.  Incomplete right bundle branch block.  No evidence of ischemia.  Assessment     ICD-10-CM   1. Essential hypertension  I10 EKG 12-Lead    valsartan (DIOVAN) 320 MG tablet    2. Pure hypercholesterolemia  E78.00     3. Hyperglycemia  R73.9     4. Drug-induced erectile dysfunction  N52.2 sildenafil (VIAGRA) 100 MG tablet    5. Primary insomnia  F51.01 temazepam (RESTORIL) 15 MG capsule      Recommendations:   Suraj Ramdass  is a 61 y.o.  male  with  hypertension, hyperlipidemia, hyperglycemia, prediabetes, mild obesity, family history of premature is CAD, gout, chronic corpulmonale and OSA not on CPAP, positive nuclear stress test in February 2016 revealing abnormal EKG response to treadmill stress test  along with inferior apical ischemia.  1. Essential hypertension Blood pressure is very well-controlled.  He has had low blood pressure at times, hence will change valsartan HCT to plain valsartan 320 mg daily.  2. Pure hypercholesterolemia Lipids are well-controlled except elevated triglycerides however he is now on Ozempic and has lost about 10 pounds in weight.  I suspect this will improve with continued weight loss.  3. Hyperglycemia Hyperglycemia reviewed, patient appears to be motivated in weight loss and hopefully along with Ozempic this will remain stable as well.  Hopefully discontinuing HCTZ will help with gout and also with improving his triglycerides.  4. Drug-induced erectile dysfunction Viagra prescribed, advised him how to use it.  5. Primary insomnia Patient is presently on Restoril, I refilled the prescription.  With regard to abnormal nuclear stress test, he has not had any angina pectoris, no clinical evidence of heart failure, I do not think he needs repeat testing with regard to the stress test or echocardiogram.  I will see him back on annual basis.  This is a 40-minute office visit and complaint review of his external labs, discussion regarding his medical issues and primary prevention.   Adrian Prows, MD, [facility] 12/04/2022, 11:48 AM Office: [phone] Pager: [phone]

## 2023-01-10 ENCOUNTER — Other Ambulatory Visit: Payer: Self-pay | Admitting: Cardiology

## 2023-01-12 ENCOUNTER — Other Ambulatory Visit: Payer: Self-pay

## 2023-01-12 ENCOUNTER — Encounter: Payer: Self-pay | Admitting: Cardiology

## 2023-01-12 ENCOUNTER — Other Ambulatory Visit: Payer: Self-pay | Admitting: Cardiology

## 2023-01-12 MED ORDER — HYDRALAZINE HCL 25 MG PO TABS: 25.0000 mg | ORAL_TABLET | Freq: Three times a day (TID) | ORAL | 1 refills | Status: DC | PRN

## 2023-02-16 ENCOUNTER — Other Ambulatory Visit: Payer: Self-pay | Admitting: Cardiology

## 2023-02-16 DIAGNOSIS — I10 Essential (primary) hypertension: Secondary | ICD-10-CM

## 2023-02-17 ENCOUNTER — Other Ambulatory Visit: Payer: Self-pay | Admitting: Cardiology

## 2023-02-17 DIAGNOSIS — I10 Essential (primary) hypertension: Secondary | ICD-10-CM

## 2023-03-06 ENCOUNTER — Other Ambulatory Visit: Payer: Self-pay

## 2023-03-06 MED ORDER — VERAPAMIL HCL ER 180 MG PO CP24: ORAL_CAPSULE | ORAL | 1 refills | Status: DC

## 2023-04-07 ENCOUNTER — Other Ambulatory Visit: Payer: Self-pay

## 2023-04-07 MED ORDER — VERAPAMIL HCL ER 180 MG PO CP24: ORAL_CAPSULE | ORAL | 1 refills | Status: DC

## 2023-09-20 ENCOUNTER — Other Ambulatory Visit: Payer: Self-pay | Admitting: Cardiology

## 2023-09-24 ENCOUNTER — Other Ambulatory Visit: Payer: Self-pay | Admitting: Cardiology

## 2023-09-24 DIAGNOSIS — I10 Essential (primary) hypertension: Secondary | ICD-10-CM

## 2023-11-23 ENCOUNTER — Other Ambulatory Visit: Payer: Self-pay | Admitting: Cardiology

## 2023-11-23 DIAGNOSIS — I10 Essential (primary) hypertension: Secondary | ICD-10-CM

## 2023-12-03 ENCOUNTER — Encounter: Payer: Self-pay | Admitting: Cardiology

## 2023-12-03 ENCOUNTER — Ambulatory Visit: Payer: BC Managed Care – PPO | Attending: Cardiology | Admitting: Cardiology

## 2023-12-03 VITALS — BP 134/80 | HR 68 | Resp 16 | Ht 70.0 in | Wt 259.4 lb

## 2023-12-03 DIAGNOSIS — I10 Essential (primary) hypertension: Secondary | ICD-10-CM

## 2023-12-03 DIAGNOSIS — F5101 Primary insomnia: Secondary | ICD-10-CM

## 2023-12-03 DIAGNOSIS — R739 Hyperglycemia, unspecified: Secondary | ICD-10-CM | POA: Diagnosis not present

## 2023-12-03 DIAGNOSIS — E7800 Pure hypercholesterolemia, unspecified: Secondary | ICD-10-CM

## 2023-12-03 DIAGNOSIS — N522 Drug-induced erectile dysfunction: Secondary | ICD-10-CM

## 2023-12-03 MED ORDER — VERAPAMIL HCL ER 180 MG PO CP24: ORAL_CAPSULE | ORAL | 3 refills | Status: DC

## 2023-12-03 MED ORDER — TEMAZEPAM 15 MG PO CAPS: 15.0000 mg | ORAL_CAPSULE | Freq: Every evening | ORAL | 2 refills | Status: DC | PRN

## 2023-12-03 MED ORDER — VALSARTAN 320 MG PO TABS: 320.0000 mg | ORAL_TABLET | Freq: Every day | ORAL | 3 refills | Status: DC

## 2023-12-03 MED ORDER — SILDENAFIL CITRATE 100 MG PO TABS: 100.0000 mg | ORAL_TABLET | Freq: Every day | ORAL | 1 refills | Status: AC | PRN

## 2023-12-03 MED ORDER — METOPROLOL SUCCINATE ER 50 MG PO TB24: 50.0000 mg | ORAL_TABLET | Freq: Every day | ORAL | 3 refills | Status: DC

## 2023-12-03 NOTE — Progress Notes (Signed)
Cardiology Office Note:  .   Date:  12/03/2023  ID:  Cline Cools, DOB 05-Nov-1961, MRN [id number] PCP: Tracey Harries, MD  [facility] Providers Cardiologist:  Yates Decamp, MD   History of Present Illness: Jeff Brewer   Sagar Orea is a 62 y.o. male with  hypertension, hyperlipidemia,  prediabetes, mild obesity, family history of premature is CAD, gout, chronic corpulmonale and OSA on CPAP, atypical chest pain with positive nuclear stress test in February 2016 revealing abnormal EKG response to treadmill stress test along with inferior apical ischemia.   Discussed the use of AI scribe software for clinical note transcription with the patient, who gave verbal consent to proceed.  History of Present Illness   The patient, with a history of hypertension, presents with concerns about high blood pressure and weight gain. He reports that his blood pressure was "going crazy" three months ago, which he managed by switching pharmacies and taking metoprolol, a medication not currently on his list. He reports no side effects from the medication. He also reports feeling short of breath three months ago, which he attributes to stress and weight gain. He has been working out but instead of losing weight, he reports his muscles are getting bigger.  Also reports that with weight gain his blood pressure was uncontrolled hence he restarted taking metoprolol succinate on a daily basis and now his blood pressure is controlled and his dyspnea has resolved.  The patient also reports low sexual activity and trouble sleeping. He has a prescription for Viagra, which he has not used yet due to fear of side effects. He also has a prescription for temazepam, which he uses occasionally when he has trouble sleeping.  The patient also mentions a redness in his eye that has been bothering him. He is considering seeing an eye doctor for this issue.      Review of Systems  Cardiovascular:  Negative for chest pain, dyspnea  on exertion and leg swelling.    Labs    External Labs:  Labs 11/30/2023:  A1c 5.7%.  Labs 11/18/2023:  Uric acid 5.2.  Vitamin D38.6.  Serum glucose 128 mg, BUN 13, creatinine 1.13, EGFR 73 mL, potassium 3.8.  Total cholesterol 118, triglycerides 230, HDL 34, LDL 47.  Hb 15.4/HCT 44.2, platelets 260.  Physical Exam:   VS:  BP 134/80 (BP Location: Left Arm, Patient Position: Sitting, Cuff Size: Large)   Pulse 68   Resp 16   Ht 5\' 10"  (1.778 m)   Wt 259 lb 6.4 oz (117.7 kg)   SpO2 98%   BMI 37.22 kg/m    Wt Readings from Last 3 Encounters:  12/03/23 259 lb 6.4 oz (117.7 kg)  12/04/22 249 lb 6.4 oz (113.1 kg)  04/21/22 254 lb 12.8 oz (115.6 kg)     Physical Exam Constitutional:      Appearance: He is obese.  Neck:     Vascular: No carotid bruit or JVD.  Cardiovascular:     Rate and Rhythm: Normal rate and regular rhythm.     Pulses: Intact distal pulses.     Heart sounds: Normal heart sounds. No murmur heard.    No gallop.  Pulmonary:     Effort: Pulmonary effort is normal.     Breath sounds: Normal breath sounds.  Abdominal:     General: Bowel sounds are normal.     Palpations: Abdomen is soft.  Musculoskeletal:     Right lower leg: No edema.     Left lower  leg: No edema.     Studies Reviewed: .    Echo- 01/30/15 1. Left ventricle cavity is normal in size. Mild to moderate concentric hypertrophy of the left ventricle. Normal global wall motion. Calculated EF 55%. 2. Right atrial cavity is slightly dilated. 3. Right ventricle cavity is slightly dilated. Normal right ventricular function. 4. Trace mitral regurgitation. 5. Trace tricuspid regurgitation. No evidence of pulmonary hypertension  EKG:    EKG Interpretation Date/Time:  Thursday December 03 2023 11:22:14 EST Ventricular Rate:  68 PR Interval:  188 QRS Duration:  90 QT Interval:  406 QTC Calculation: 431 R Axis:   0  Text Interpretation: EKG 12/03/2023: Normal sinus rhythm at rate of 68  bpm, normal axis.  No evidence of ischemia, normal EKG. Confirmed by Delrae Rend [phone]) on 12/03/2023 11:39:38 AM    EKG 04/03/2021: Normal sinus rhythm at rate of 97 bpm, leftward axis. Incomplete right bundle branch block. No evidence of ischemia.   Medications and allergies    Allergies  Allergen Reactions   Sulfa Antibiotics Rash     Current Outpatient Medications:    allopurinol (ZYLOPRIM) 100 MG tablet, Take 1 tablet (100 mg total) by mouth daily., Disp: 90 tablet, Rfl: 0   azelastine (ASTELIN) 0.1 % nasal spray, Place 2 sprays into both nostrils 2 (two) times daily., Disp: , Rfl:    colchicine 0.6 MG tablet, Take 0.6 mg by mouth as needed (very seldom)., Disp: , Rfl:    fexofenadine (ALLEGRA) 180 MG tablet, Take 180 mg by mouth as needed. As needed, Disp: , Rfl:    hydrALAZINE (APRESOLINE) 25 MG tablet, TAKE ONE TABLET BY MOUTH THREE TIMES DAILY AS NEEDED FOR SBP >140/80 mm Hg, Disp: 180 tablet, Rfl: 1   meloxicam (MOBIC) 15 MG tablet, Take 15 mg by mouth daily as needed., Disp: , Rfl:    NON FORMULARY, Honey ginger with lime, Disp: , Rfl:    probenecid (BENEMID) 500 MG tablet, Take 1 tablet (500 mg total) by mouth daily., Disp: 90 tablet, Rfl: 0   rosuvastatin (CRESTOR) 20 MG tablet, TAKE ONE (1) TABLET BY MOUTH EACH DAY, Disp: 90 tablet, Rfl: 3   Vitamin D, Ergocalciferol, (DRISDOL) 50000 UNITS CAPS capsule, Take 50,000 Units by mouth every 7 (seven) days., Disp: , Rfl:    metoprolol succinate (TOPROL-XL) 50 MG 24 hr tablet, Take 1 tablet (50 mg total) by mouth daily. Take with or immediately following a meal., Disp: 90 tablet, Rfl: 3   sildenafil (VIAGRA) 100 MG tablet, Take 1 tablet (100 mg total) by mouth daily as needed for erectile dysfunction., Disp: 30 tablet, Rfl: 1   temazepam (RESTORIL) 15 MG capsule, Take 1 capsule (15 mg total) by mouth at bedtime as needed for sleep., Disp: 30 capsule, Rfl: 2   valsartan (DIOVAN) 320 MG tablet, Take 1 tablet (320 mg total) by  mouth daily., Disp: 90 tablet, Rfl: 3   verapamil (VERELAN) 180 MG 24 hr capsule, TAKE 1 CAPSULE BY MOUTH EVERY  EVENING AFTER DINNER, Disp: 90 capsule, Rfl: 3   ASSESSMENT AND PLAN: .      ICD-10-CM   1. Essential hypertension  I10 EKG 12-Lead    valsartan (DIOVAN) 320 MG tablet    metoprolol succinate (TOPROL-XL) 50 MG 24 hr tablet    2. Pure hypercholesterolemia  E78.00     3. Hyperglycemia  R73.9     4. Drug-induced erectile dysfunction  N52.2 sildenafil (VIAGRA) 100 MG tablet    5. Primary insomnia  F51.01 temazepam (RESTORIL) 15 MG capsule      Assessment and Plan    Hypertension and hypercholesterolemia Previously uncontrolled blood pressure, now well-managed with Metoprolol 50mg  daily, Valsartan, and Verapamil 180mg  nightly. No current symptoms of chest pain or shortness of breath. EKG normal.  Lipids reviewed, triglyceride elevation related to poor diet, recently went to [place] and spent 3 weeks and gained about 12 to 14 pounds in weight. -Discussed lifestyle modification. -Continue current medications. -Refill Metoprolol, Valsartan, and Verapamil prescriptions.  Erectile Dysfunction Low sexual activity reported, previously prescribed Viagra. -Refill Viagra prescription.  Insomnia Occasional trouble sleeping, previously prescribed Temazepam. -Refill Temazepam prescription.  Eye Irritation Complaint of persistent eye irritation. -Recommend evaluation by [facility].  Weight Gain Recent weight gain reported, possibly related to stress and dietary changes during travel. -Encourage stress management and healthy diet. -Consider Compounding Maunjaro and sent new Rx. D/W PCP regarding this.   Follow-up in 1 year.   Signed,  Yates Decamp, MD, [facility] 12/03/2023, 12:47 PM [facility] [address] [place], [place] [address] Phone: [phone]. Fax:  [phone]

## 2023-12-03 NOTE — Patient Instructions (Addendum)

## 2024-02-19 ENCOUNTER — Other Ambulatory Visit: Payer: Self-pay | Admitting: Cardiology

## 2024-02-19 DIAGNOSIS — I10 Essential (primary) hypertension: Secondary | ICD-10-CM

## 2024-05-03 ENCOUNTER — Ambulatory Visit: Payer: BC Managed Care – PPO | Admitting: Family Medicine

## 2024-08-16 ENCOUNTER — Ambulatory Visit: Admitting: Family Medicine

## 2024-08-16 ENCOUNTER — Encounter: Payer: Self-pay | Admitting: Family Medicine

## 2024-08-16 VITALS — BP 136/80 | HR 70 | Resp 16 | Ht 70.0 in | Wt 259.0 lb

## 2024-08-16 DIAGNOSIS — M109 Gout, unspecified: Secondary | ICD-10-CM

## 2024-08-16 DIAGNOSIS — E7800 Pure hypercholesterolemia, unspecified: Secondary | ICD-10-CM

## 2024-08-16 DIAGNOSIS — E1169 Type 2 diabetes mellitus with other specified complication: Secondary | ICD-10-CM | POA: Diagnosis not present

## 2024-08-16 DIAGNOSIS — Z23 Encounter for immunization: Secondary | ICD-10-CM

## 2024-08-16 DIAGNOSIS — I10 Essential (primary) hypertension: Secondary | ICD-10-CM | POA: Diagnosis not present

## 2024-08-16 DIAGNOSIS — G4700 Insomnia, unspecified: Secondary | ICD-10-CM | POA: Diagnosis not present

## 2024-08-16 DIAGNOSIS — E559 Vitamin D deficiency, unspecified: Secondary | ICD-10-CM | POA: Diagnosis not present

## 2024-08-16 DIAGNOSIS — Z1211 Encounter for screening for malignant neoplasm of colon: Secondary | ICD-10-CM

## 2024-08-16 DIAGNOSIS — E119 Type 2 diabetes mellitus without complications: Secondary | ICD-10-CM | POA: Insufficient documentation

## 2024-08-16 LAB — COMPREHENSIVE METABOLIC PANEL WITH GFR
ALT: 31 U/L (ref 0–53)
AST: 25 U/L (ref 0–37)
Albumin: 4.4 g/dL (ref 3.5–5.2)
Alkaline Phosphatase: 85 U/L (ref 39–117)
BUN: 12 mg/dL (ref 6–23)
CO2: 25 meq/L (ref 19–32)
Calcium: 9.7 mg/dL (ref 8.4–10.5)
Chloride: 102 meq/L (ref 96–112)
Creatinine, Ser: 1.16 mg/dL (ref 0.40–1.50)
GFR: 66.9 mL/min (ref >60.00)
Glucose, Bld: 100 mg/dL — ABNORMAL HIGH (ref 70–99)
Potassium: 4 meq/L (ref 3.5–5.1)
Sodium: 140 meq/L (ref 135–145)
Total Bilirubin: 0.5 mg/dL (ref 0.2–1.2)
Total Protein: 7.8 g/dL (ref 6.0–8.3)

## 2024-08-16 LAB — MICROALBUMIN / CREATININE URINE RATIO
Creatinine,U: 64.5 mg/dL
Microalb Creat Ratio: 90.1 mg/g — ABNORMAL HIGH (ref 0.0–30.0)
Microalb, Ur: 5.8 mg/dL — ABNORMAL HIGH (ref 0.0–1.9)

## 2024-08-16 LAB — LIPID PANEL
Cholesterol: 118 mg/dL (ref 0–200)
HDL: 39.2 mg/dL (ref >39.00)
LDL Cholesterol: 31 mg/dL (ref 0–99)
NonHDL: 79.23
Total CHOL/HDL Ratio: 3
Triglycerides: 241 mg/dL — ABNORMAL HIGH (ref 0.0–149.0)
VLDL: 48.2 mg/dL — ABNORMAL HIGH (ref 0.0–40.0)

## 2024-08-16 LAB — URIC ACID: Uric Acid, Serum: 5.1 mg/dL (ref 4.0–7.8)

## 2024-08-16 LAB — HEMOGLOBIN A1C: Hgb A1c MFr Bld: 6.3 % (ref 4.6–6.5)

## 2024-08-16 LAB — VITAMIN D 25 HYDROXY (VIT D DEFICIENCY, FRACTURES): VITD: 34.45 ng/mL (ref 30.00–100.00)

## 2024-08-16 NOTE — Progress Notes (Signed)
 HPI: Jeff Brewer is a 63 y.o. male  with PMHx significant for prediabetes, HTN, hypercholesteremia, demyelinating disease, insomnia, and gout, who is here today to establish care.  Former PCP: Dr. Pura  Last preventive routine visit: 11/18/23 Follows up with Dr. Ziolkowska of rheumatology; next f/u is scheduled on 10/18/24. Established with Dr. Ladona of cardiology; f/u annually.    Exercise: Does cardio and light weight training (no more than 80 lbs) for 90 minutes 2-3x/week.  Diet: Home cooked meals   Smoking: Former; smoked cigars 30+ Alcohol consumption: Drinks 4-5 drinks whiskey on weekends. Drinks more when on vacation  Dental: UTD with routine dental care.  Vision: UTD and almost due for routine vision exam. Last seen last year. Recently moved and is in the process of re-establishing in the area.    Chronic medical problems:   Hypertension Medications: Valsartan  320 mg once daily, Metoprolol  succinate 50 mg once daily.  Taking Hydralazine  prn, usually takes when he will be eating out. He  Side effects: None.  BP readings at home are in the 120s/70s.  He endorses slight white coat syndrome.  Negative for unusual or severe headache, visual changes, exertional chest pain, dyspnea,  focal weakness, or edema.  Lab Results  Component Value Date   CREATININE 1.30 (H) 06/08/2020   BUN 19 06/08/2020   NA 138 06/08/2020   K 4.4 06/08/2020   CL 100 06/08/2020   CO2 21 06/08/2020    Hypercholesteremia Currently on Rosuvastatin  20 mg once daily.  Side effects from medication: None.  Following a low fat diet.  No acute concerns reported in this regard.  Lab Results  Component Value Date   CHOL 126 06/08/2020   HDL 50 06/08/2020   LDLCALC 54 06/08/2020   TRIG 124 06/08/2020    Gout Currently on Allopurinol  100 mg once daily and Probenecid  500 mg once daily.  Good compliance and tolerance reported.  Following up with rheumatology every 6 months.  He has been  receiving injections at his follow ups.  He reports being recommended to consider a hip replacement in the future.    Diabetes Mellitus II: HgA1C 6.9 in 06/2014. - Not currently on pharmacologic treatment.  - He has taken Ozempic in the past - Eye exam: UTD - Foot exam: UTD, done today.  - Negative for symptoms of hypoglycemia, polyuria, polydipsia, numbness extremities, foot ulcers/trauma. Last HgA1C 11/2023 was 5.7.  Lab Results  Component Value Date   HGBA1C 5.8 (H) 06/08/2020    Review of Systems See other pertinent positives and negatives in HPI.  Current Outpatient Medications on File Prior to Visit  Medication Sig Dispense Refill   allopurinol  (ZYLOPRIM ) 100 MG tablet Take 1 tablet (100 mg total) by mouth daily. 90 tablet 0   azelastine (ASTELIN) 0.1 % nasal spray Place 2 sprays into both nostrils 2 (two) times daily.     colchicine 0.6 MG tablet Take 0.6 mg by mouth as needed (very seldom).     fexofenadine (ALLEGRA) 180 MG tablet Take 180 mg by mouth as needed. As needed     hydrALAZINE  (APRESOLINE ) 25 MG tablet TAKE ONE TABLET BY MOUTH THREE TIMES DAILY AS NEEDED FOR SBP >140/80 mm Hg 180 tablet 1   meloxicam (MOBIC) 15 MG tablet Take 15 mg by mouth daily as needed.     metoprolol  succinate (TOPROL -XL) 50 MG 24 hr tablet Take 1 tablet (50 mg total) by mouth daily. Take with or immediately following a meal. 90 tablet 3  NON FORMULARY Honey ginger with lime     probenecid  (BENEMID ) 500 MG tablet Take 1 tablet (500 mg total) by mouth daily. 90 tablet 0   rosuvastatin  (CRESTOR ) 20 MG tablet Take 1 tablet (20 mg total) by mouth daily. 90 tablet 3   sildenafil  (VIAGRA ) 100 MG tablet Take 1 tablet (100 mg total) by mouth daily as needed for erectile dysfunction. 30 tablet 1   temazepam  (RESTORIL ) 15 MG capsule Take 1 capsule (15 mg total) by mouth at bedtime as needed for sleep. 30 capsule 2   valsartan  (DIOVAN ) 320 MG tablet Take 1 tablet (320 mg total) by mouth daily. 90 tablet  3   verapamil  (VERELAN ) 180 MG 24 hr capsule TAKE 1 CAPSULE BY MOUTH EVERY  EVENING AFTER DINNER 90 capsule 3   Vitamin D , Ergocalciferol , (DRISDOL) 50000 UNITS CAPS capsule Take 50,000 Units by mouth every 7 (seven) days.     No current facility-administered medications on file prior to visit.    Past Medical History:  Diagnosis Date   Atypical chest pain    Diabetes mellitus without complication (HCC)    Essential hypertension 02/07/2019   Fatigue 02/08/2019   Gout    Hypercholesteremia    Hyperglycemia 02/07/2019   Hyperlipidemia 02/07/2019   Hypertension    Obesity    moderate   Obstructive sleep apnea 02/08/2019   Resolved with weight loss   OSA (obstructive sleep apnea)    Allergies  Allergen Reactions   Sulfa Antibiotics Rash    Family History  Problem Relation Age of Onset   Heart Problems Mother    Heart Problems Father        known hole in heart    Social History   Socioeconomic History   Marital status: Married    Spouse name: Secretary/administrator   Number of children: 1   Years of education: Masters   Highest education level: Not on file  Occupational History    Comment: [employer]  Tobacco Use   Smoking status: Former    Current packs/day: 0.00    Types: Cigarettes    Quit date: 1990    Years since quitting: 35.6   Smokeless tobacco: Never   Tobacco comments:    quit 20 years ago  Vaping Use   Vaping status: Never Used  Substance and Sexual Activity   Alcohol use: Yes    Alcohol/week: 0.0 standard drinks of alcohol    Comment: 4-5 drinks weekly   Drug use: No   Sexual activity: Not on file  Other Topics Concern   Not on file  Social History Narrative   Patient consumes 1 cup of coffee am and pm   Right handed   Social Drivers of Health   Financial Resource Strain: Low Risk  (01/26/2024)   Received from [facility]   Overall Financial Resource Strain (CARDIA)    Difficulty of Paying Living Expenses: Not hard at all  Food Insecurity: No Food  Insecurity (01/26/2024)   Received from [facility]   Hunger Vital Sign    Within the past 12 months, you worried that your food would run out before you got the money to buy more.: Never true    Within the past 12 months, the food you bought just didn't last and you didn't have money to get more.: Never true  Transportation Needs: No Transportation Needs (01/26/2024)   Received from [facility] - Transportation    Lack of Transportation (Medical): No  Lack of Transportation (Non-Medical): No  Physical Activity: Insufficiently Active (11/15/2023)   Received from [facility]   Exercise Vital Sign    On average, how many days per week do you engage in moderate to strenuous exercise (like a brisk walk)?: 2 days    On average, how many minutes do you engage in exercise at this level?: 50 min  Stress: No Stress Concern Present (11/15/2023)   Received from [facility] - Occupational Stress Questionnaire    Feeling of Stress : Only a little  Social Connections: Socially Integrated (11/15/2023)   Received from [facility]   Social Network    How would you rate your social network (family, work, friends)?: Good participation with social networks    Vitals:   08/16/24 1113  BP: 136/80  Pulse: 70  SpO2: 95%    Body mass index is 37.16 kg/m.  Physical Exam Vitals and nursing note reviewed.  Constitutional:      General: He is not in acute distress.    Appearance: He is well-developed.  HENT:     Head: Normocephalic and atraumatic.     Mouth/Throat:     Mouth: Mucous membranes are moist.     Pharynx: Oropharynx is clear.  Eyes:     Conjunctiva/sclera: Conjunctivae normal.  Cardiovascular:     Rate and Rhythm: Normal rate and regular rhythm.     Pulses:          Dorsalis pedis pulses are 2+ on the right side and 2+ on the left side.     Heart sounds: No murmur heard. Pulmonary:     Effort: Pulmonary effort is normal.  No respiratory distress.     Breath sounds: Normal breath sounds.  Abdominal:     Palpations: Abdomen is soft. There is no hepatomegaly or mass.     Tenderness: There is no abdominal tenderness.  Lymphadenopathy:     Cervical: No cervical adenopathy.  Skin:    General: Skin is warm.     Findings: No erythema or rash.  Neurological:     Mental Status: He is alert and oriented to person, place, and time.     Cranial Nerves: No cranial nerve deficit.     Gait: Gait normal.  Psychiatric:     Comments: Well groomed, good eye contact.     ASSESSMENT AND PLAN: Mr. Domani Bakos was seen today to establish care.   Type 2 diabetes mellitus with other specified complication, without long-term current use of insulin [facility]) Assessment & Plan: HgA1C has been at goal, 5.7 in 11/2023. For now continue nonpharmacologic treatment, further recommendation will be given according to hemoglobin A1c result. Regular exercise and healthy diet with avoidance of added sugar food intake is an important part of treatment and recommended. Annual eye exam, periodic dental and foot care recommended. F/U in 5-6 months.  Orders: -     Microalbumin / creatinine urine ratio; Future -     Comprehensive metabolic panel with GFR; Future -     Hemoglobin A1c; Future  Hypercholesteremia Assessment & Plan: Continue rosuvastatin  20 mg daily. Low-fat diet also recommended. Further recommendation will be given according to lipid panel result.  Orders: -     Lipid panel; Future  Essential hypertension Assessment & Plan: BP today mildly elevated, reporting lower BPs at home (120s/70s). Continue valsartan  320 mg daily, fenofibrate 180 milligrams daily, and metoprolol  succinate 50 mg daily. Low-salt diet recommended. Follows with cardiologist.  Vitamin D  deficiency, unspecified -     VITAMIN D  25 Hydroxy (Vit-D Deficiency, Fractures); Future  Colon cancer screening -     Ambulatory referral to  Gastroenterology  Need for pneumococcal vaccination -     Pneumococcal conjugate vaccine 20-valent  Gout, unspecified cause, unspecified chronicity, unspecified site Assessment & Plan: Problem is well-controlled with current regimen, allopurinol  and milligrams daily and probenecid  500 mg daily. He is also on colchicine 0.6 mg daily as needed. Low purine diet recommended.  Orders: -     Uric acid  Insomnia, unspecified type Assessment & Plan: Currently he is on temazepam  15 mg daily as needed, this medication has been prescribed by Dr. Ladona. He reports a negative sleep study in the past. Continue good sleep hygiene.     Return in about 6 months (around 02/16/2025) for chronic problems.   I, Vernell Forest, acting as a scribe for Georgianne Gritz [place], MD., have documented all relevant documentation on the behalf of Serafin Decatur [place], MD, as directed by   while in the presence of Dannell Raczkowski [place], MD.  I, [name] [place], MD, have reviewed all documentation for this visit. The documentation on 08/16/24 for the exam, diagnosis, procedures, and orders are all accurate and complete.   Holly Pring G. [place], MD  [facility]

## 2024-08-16 NOTE — Assessment & Plan Note (Signed)
 Continue rosuvastatin  20 mg daily. Low-fat diet also recommended. Further recommendation will be given according to lipid panel result.

## 2024-08-16 NOTE — Assessment & Plan Note (Signed)
 Currently he is on temazepam  15 mg daily as needed, this medication has been prescribed by Dr. Ladona. He reports a negative sleep study in the past. Continue good sleep hygiene.

## 2024-08-16 NOTE — Patient Instructions (Addendum)
 A few things to remember from today's visit:  Type 2 diabetes mellitus with other specified complication, without long-term current use of insulin (HCC) - Plan: Microalbumin / creatinine urine ratio, Comprehensive metabolic panel with GFR, Hemoglobin A1c  Hypercholesteremia - Plan: Lipid panel  Essential hypertension  Vitamin D  deficiency, unspecified - Plan: VITAMIN D  25 Hydroxy (Vit-D Deficiency, Fractures)  Colon cancer screening - Plan: Ambulatory referral to Gastroenterology  No cambios hoy. If you need refills for medications you take chronically, please call your pharmacy. Do not use My Chart to request refills or for acute issues that need immediate attention. If you send a my chart message, it may take a few days to be addressed, specially if I am not in the office.  Please be sure medication list is accurate. If a new problem present, please set up appointment sooner than planned today.

## 2024-08-16 NOTE — Assessment & Plan Note (Signed)
 BP today mildly elevated, reporting lower BPs at home (120s/70s). Continue valsartan  320 mg daily, fenofibrate 180 milligrams daily, and metoprolol  succinate 50 mg daily. Low-salt diet recommended. Follows with cardiologist.

## 2024-08-16 NOTE — Assessment & Plan Note (Signed)
 HgA1C has been at goal, 5.7 in 11/2023. For now continue nonpharmacologic treatment, further recommendation will be given according to hemoglobin A1c result. Regular exercise and healthy diet with avoidance of added sugar food intake is an important part of treatment and recommended. Annual eye exam, periodic dental and foot care recommended. F/U in 5-6 months.

## 2024-08-16 NOTE — Assessment & Plan Note (Addendum)
 This problem is followed by rheumatologist. Requested uric acid to be added to labs today. Problem is well-controlled with current regimen, allopurinol  100 mg daily and probenecid  500 mg daily. He is also on colchicine 0.6 mg daily as needed. Low purine diet recommended.

## 2024-08-17 ENCOUNTER — Ambulatory Visit: Admitting: Family Medicine

## 2024-08-19 ENCOUNTER — Ambulatory Visit: Payer: Self-pay | Admitting: Family Medicine

## 2024-08-19 NOTE — Assessment & Plan Note (Signed)
 Mild. 25 OH vit D was low at 26.8 in 07/2021. Currently not on vit D supplementation.

## 2024-09-06 ENCOUNTER — Other Ambulatory Visit: Payer: Self-pay | Admitting: Family Medicine

## 2024-09-06 DIAGNOSIS — E1169 Type 2 diabetes mellitus with other specified complication: Secondary | ICD-10-CM

## 2024-09-06 MED ORDER — TIRZEPATIDE 2.5 MG/0.5ML ~~LOC~~ SOAJ: 2.5000 mg | SUBCUTANEOUS | 1 refills | Status: DC

## 2024-09-12 ENCOUNTER — Other Ambulatory Visit: Payer: Self-pay | Admitting: Cardiology

## 2024-11-10 ENCOUNTER — Encounter: Payer: Self-pay | Admitting: Internal Medicine

## 2024-11-27 NOTE — Progress Notes (Unsigned)
  Cardiology Office Note:  .   Date:  11/27/2024  ID:  Spiro Ausborn, DOB 07/06/1961, MRN [id number] PCP: Jordan, Betty G, MD  [facility] Providers Cardiologist:  Gordy Bergamo, MD { Click to update primary MD,subspecialty MD or APP then REFRESH:1}  History of Present Illness: Jeff Brewer   Kosta Schnitzler is a 63 y.o. male with hypertension, hyperlipidemia, prediabetes, mild obesity, family history of premature is CAD, gout, chronic corpulmonale and OSA on CPAP, atypical chest pain with positive nuclear stress test in February 2016 revealing abnormal EKG response to treadmill stress test along with inferior apical ischemia.     Discussed the use of AI scribe software for clinical note transcription with the patient, who gave verbal consent to proceed.  History of Present Illness    Cardiac Studies relevent.    Echo- 01/30/15 1. Left ventricle cavity is normal in size. Mild to moderate concentric hypertrophy of the left ventricle. Normal global wall motion. Calculated EF 55%. 2. Right atrial cavity is slightly dilated. 3. Right ventricle cavity is slightly dilated. Normal right ventricular function.  Labs   Lab Results  Component Value Date   CHOL 118 08/16/2024   HDL 39.20 08/16/2024   LDLCALC 31 08/16/2024   TRIG 241.0 (H) 08/16/2024   CHOLHDL 3 08/16/2024   No results found for: LIPOA  Recent Labs    08/16/24 1219  NA 140  K 4.0  CL 102  CO2 25  GLUCOSE 100*  BUN 12  CREATININE 1.16  CALCIUM  9.7    Lab Results  Component Value Date   ALT 31 08/16/2024   AST 25 08/16/2024   ALKPHOS 85 08/16/2024   BILITOT 0.5 08/16/2024       No data to display         Lab Results  Component Value Date   HGBA1C 6.3 08/16/2024    Lab Results  Component Value Date   TSH 2.870 06/08/2020     ROS  ***ROS Physical Exam:   VS:  There were no vitals taken for this visit.   Wt Readings from Last 3 Encounters:  08/16/24 259 lb (117.5 kg)  12/03/23 259 lb 6.4 oz (117.7  kg)  12/04/22 249 lb 6.4 oz (113.1 kg)    BP Readings from Last 3 Encounters:  08/16/24 136/80  12/03/23 134/80  12/04/22 121/61   ***Physical Exam EKG:         ASSESSMENT AND PLAN: .      ICD-10-CM   1. Abnormal nuclear stress test  R94.39     2. Pure hypercholesterolemia  E78.00     3. Hyperglycemia  R73.9     4. Essential hypertension  I10      Assessment & Plan    Follow up: ***  Signed,  Gordy Bergamo, MD, [facility] 11/27/2024, 8:57 PM [facility] [address] [place], [place] [address] Phone: [phone]. Fax:  [phone]

## 2024-11-30 ENCOUNTER — Encounter: Payer: Self-pay | Admitting: Cardiology

## 2024-11-30 ENCOUNTER — Ambulatory Visit: Attending: Cardiology | Admitting: Cardiology

## 2024-11-30 VITALS — BP 128/80 | HR 70 | Resp 16 | Ht 70.0 in | Wt 245.8 lb

## 2024-11-30 DIAGNOSIS — E119 Type 2 diabetes mellitus without complications: Secondary | ICD-10-CM | POA: Diagnosis not present

## 2024-11-30 DIAGNOSIS — E7800 Pure hypercholesterolemia, unspecified: Secondary | ICD-10-CM | POA: Diagnosis not present

## 2024-11-30 DIAGNOSIS — R9439 Abnormal result of other cardiovascular function study: Secondary | ICD-10-CM

## 2024-11-30 DIAGNOSIS — I10 Essential (primary) hypertension: Secondary | ICD-10-CM | POA: Diagnosis not present

## 2024-11-30 DIAGNOSIS — F5101 Primary insomnia: Secondary | ICD-10-CM

## 2024-11-30 DIAGNOSIS — R739 Hyperglycemia, unspecified: Secondary | ICD-10-CM

## 2024-11-30 MED ORDER — TEMAZEPAM 22.5 MG PO CAPS: 22.5000 mg | ORAL_CAPSULE | Freq: Every evening | ORAL | 1 refills | Status: AC | PRN

## 2024-12-14 ENCOUNTER — Other Ambulatory Visit: Payer: Self-pay | Admitting: Cardiology

## 2024-12-14 DIAGNOSIS — I10 Essential (primary) hypertension: Secondary | ICD-10-CM

## 2025-01-06 ENCOUNTER — Other Ambulatory Visit: Payer: Self-pay | Admitting: Cardiology

## 2025-01-06 DIAGNOSIS — I10 Essential (primary) hypertension: Secondary | ICD-10-CM

## 2025-01-09 ENCOUNTER — Other Ambulatory Visit: Payer: Self-pay | Admitting: Cardiology

## 2025-01-09 DIAGNOSIS — I10 Essential (primary) hypertension: Secondary | ICD-10-CM

## 2025-01-11 ENCOUNTER — Other Ambulatory Visit (HOSPITAL_COMMUNITY): Payer: Self-pay

## 2025-01-12 ENCOUNTER — Encounter: Payer: Self-pay | Admitting: Cardiology

## 2025-01-13 ENCOUNTER — Telehealth: Payer: Self-pay | Admitting: Cardiology

## 2025-01-13 MED ORDER — VERAPAMIL HCL ER 180 MG PO CP24: ORAL_CAPSULE | ORAL | 2 refills | Status: AC

## 2025-01-13 NOTE — Telephone Encounter (Signed)
" °*  STAT* If patient is at the pharmacy, call can be transferred to refill team.   1. Which medications need to be refilled? (please list name of each medication and dose if known)   verapamil  (VERELAN ) 180 MG 24 hr capsule   2. Would you like to learn more about the convenience, safety, & potential cost savings by using the [facility]?   3. Are you open to using the [facility] (Type [facility]. ).  4. Which pharmacy/location (including street and city if local pharmacy) is medication to be sent to?  [facility] [id number] - [place], [place] - [address]   5. Do they need a 30 day or 90 day supply?   90 day  Patient stated he still has medication left.    "

## 2025-01-14 NOTE — Telephone Encounter (Signed)
 Yes we can

## 2025-01-16 NOTE — Telephone Encounter (Signed)
 Reifll Verapamil  has been sent, pt aware.

## 2025-01-18 ENCOUNTER — Other Ambulatory Visit (HOSPITAL_COMMUNITY): Payer: Self-pay

## 2025-01-18 ENCOUNTER — Telehealth: Payer: Self-pay | Admitting: Pharmacy Technician

## 2025-01-18 NOTE — Telephone Encounter (Signed)
 Pharmacy Patient Advocate Encounter   Received notification from [employer] that prior authorization for Mounjaro  2.5MG /0.5ML auto-injectors is required/requested.   Insurance verification completed.   The patient is insured through [facility].   Per test claim: PA required; PA submitted to above mentioned insurance via Latent Key/confirmation #/EOC [facility] Status is pending

## 2025-01-19 ENCOUNTER — Other Ambulatory Visit (HOSPITAL_COMMUNITY): Payer: Self-pay

## 2025-01-19 NOTE — Telephone Encounter (Signed)
 Pharmacy Patient Advocate Encounter  Received notification from [facility] that Prior Authorization for Mounjaro  2.5MG /0.5ML auto-injectors has been APPROVED from 01/18/2025 to 01/18/2026. Ran test claim, Copay is $25.00. This test claim was processed through [facility]- copay amounts may vary at other pharmacies due to pharmacy/plan contracts, or as the patient moves through the different stages of their insurance plan.   PA #/Case ID/Reference #: [id number]

## 2025-01-20 ENCOUNTER — Other Ambulatory Visit: Payer: Self-pay | Admitting: Family Medicine

## 2025-01-20 ENCOUNTER — Encounter: Payer: Self-pay | Admitting: Family Medicine

## 2025-01-20 DIAGNOSIS — E1169 Type 2 diabetes mellitus with other specified complication: Secondary | ICD-10-CM

## 2025-01-20 MED ORDER — TIRZEPATIDE 2.5 MG/0.5ML ~~LOC~~ SOAJ: 2.5000 mg | SUBCUTANEOUS | 0 refills | Status: AC

## 2025-02-21 ENCOUNTER — Ambulatory Visit: Admitting: Family Medicine
